# Patient Record
Sex: Female | Born: 1950 | Race: White | Hispanic: No | Marital: Married | Smoking: Never smoker
Health system: Southern US, Community
[De-identification: ages and names within clinical notes are randomized; demographics above are authoritative.]

## PROBLEM LIST (undated history)

## (undated) DIAGNOSIS — IMO0002 Reserved for concepts with insufficient information to code with codable children: Secondary | ICD-10-CM

## (undated) DIAGNOSIS — G2581 Restless legs syndrome: Secondary | ICD-10-CM

## (undated) DIAGNOSIS — M1990 Unspecified osteoarthritis, unspecified site: Secondary | ICD-10-CM

## (undated) DIAGNOSIS — M8580 Other specified disorders of bone density and structure, unspecified site: Secondary | ICD-10-CM

## (undated) DIAGNOSIS — G4700 Insomnia, unspecified: Secondary | ICD-10-CM

## (undated) HISTORY — PX: OTHER SURGICAL HISTORY: SHX169

## (undated) HISTORY — DX: Unspecified osteoarthritis, unspecified site: M19.90

## (undated) HISTORY — DX: Reserved for concepts with insufficient information to code with codable children: IMO0002

## (undated) HISTORY — DX: Insomnia, unspecified: G47.00

## (undated) HISTORY — DX: Restless legs syndrome: G25.81

## (undated) HISTORY — DX: Other specified disorders of bone density and structure, unspecified site: M85.80

## (undated) HISTORY — PX: BUNIONECTOMY: SHX129

---

## 1998-07-24 ENCOUNTER — Other Ambulatory Visit: Admission: RE | Admit: 1998-07-24 | Discharge: 1998-07-24 | Payer: Self-pay | Admitting: *Deleted

## 1999-07-24 ENCOUNTER — Other Ambulatory Visit: Admission: RE | Admit: 1999-07-24 | Discharge: 1999-07-24 | Payer: Self-pay | Admitting: *Deleted

## 1999-07-28 ENCOUNTER — Encounter: Admission: RE | Admit: 1999-07-28 | Discharge: 1999-07-28 | Payer: Self-pay | Admitting: *Deleted

## 1999-07-28 ENCOUNTER — Encounter: Payer: Self-pay | Admitting: *Deleted

## 1999-09-28 HISTORY — PX: ABDOMINAL HYSTERECTOMY: SHX81

## 1999-10-28 ENCOUNTER — Encounter (INDEPENDENT_AMBULATORY_CARE_PROVIDER_SITE_OTHER): Payer: Self-pay | Admitting: Specialist

## 1999-10-28 ENCOUNTER — Other Ambulatory Visit: Admission: RE | Admit: 1999-10-28 | Discharge: 1999-10-28 | Payer: Self-pay | Admitting: *Deleted

## 2000-06-13 ENCOUNTER — Other Ambulatory Visit: Admission: RE | Admit: 2000-06-13 | Discharge: 2000-06-13 | Payer: Self-pay | Admitting: *Deleted

## 2004-05-04 ENCOUNTER — Ambulatory Visit (HOSPITAL_COMMUNITY): Admission: RE | Admit: 2004-05-04 | Discharge: 2004-05-04 | Payer: Self-pay | Admitting: Gastroenterology

## 2004-05-04 ENCOUNTER — Encounter (INDEPENDENT_AMBULATORY_CARE_PROVIDER_SITE_OTHER): Payer: Self-pay | Admitting: *Deleted

## 2005-05-28 DIAGNOSIS — IMO0002 Reserved for concepts with insufficient information to code with codable children: Secondary | ICD-10-CM

## 2005-05-28 HISTORY — DX: Reserved for concepts with insufficient information to code with codable children: IMO0002

## 2005-05-28 HISTORY — PX: OTHER SURGICAL HISTORY: SHX169

## 2006-08-29 ENCOUNTER — Ambulatory Visit: Payer: Self-pay | Admitting: Pulmonary Disease

## 2006-10-11 ENCOUNTER — Ambulatory Visit: Payer: Self-pay | Admitting: Pulmonary Disease

## 2006-12-05 ENCOUNTER — Other Ambulatory Visit: Admission: RE | Admit: 2006-12-05 | Discharge: 2006-12-05 | Payer: Self-pay | Admitting: Obstetrics and Gynecology

## 2007-12-18 ENCOUNTER — Other Ambulatory Visit: Admission: RE | Admit: 2007-12-18 | Discharge: 2007-12-18 | Payer: Self-pay | Admitting: Obstetrics & Gynecology

## 2008-02-06 ENCOUNTER — Ambulatory Visit: Payer: Self-pay

## 2008-03-05 ENCOUNTER — Ambulatory Visit: Payer: Self-pay

## 2008-03-05 ENCOUNTER — Encounter (INDEPENDENT_AMBULATORY_CARE_PROVIDER_SITE_OTHER): Payer: Self-pay | Admitting: Family Medicine

## 2008-08-01 DIAGNOSIS — G4700 Insomnia, unspecified: Secondary | ICD-10-CM | POA: Insufficient documentation

## 2008-08-01 DIAGNOSIS — J309 Allergic rhinitis, unspecified: Secondary | ICD-10-CM | POA: Insufficient documentation

## 2008-08-02 ENCOUNTER — Ambulatory Visit: Payer: Self-pay | Admitting: Pulmonary Disease

## 2008-08-02 DIAGNOSIS — G2581 Restless legs syndrome: Secondary | ICD-10-CM

## 2008-08-05 LAB — CONVERTED CEMR LAB
Ferritin: 49.5 ng/mL (ref 10.0–291.0)
Iron: 108 ug/dL (ref 42–145)
Saturation Ratios: 30.4 % (ref 20.0–50.0)
Transferrin: 254.1 mg/dL (ref >=212.0)

## 2008-08-06 ENCOUNTER — Telehealth (INDEPENDENT_AMBULATORY_CARE_PROVIDER_SITE_OTHER): Payer: Self-pay | Admitting: *Deleted

## 2008-08-29 ENCOUNTER — Telehealth: Payer: Self-pay | Admitting: Pulmonary Disease

## 2009-08-04 ENCOUNTER — Telehealth: Payer: Self-pay | Admitting: Pulmonary Disease

## 2009-09-08 ENCOUNTER — Ambulatory Visit: Payer: Self-pay | Admitting: Pulmonary Disease

## 2010-07-29 ENCOUNTER — Ambulatory Visit: Payer: Self-pay | Admitting: Pulmonary Disease

## 2010-08-04 ENCOUNTER — Telehealth: Payer: Self-pay | Admitting: Pulmonary Disease

## 2010-08-07 ENCOUNTER — Telehealth: Payer: Self-pay | Admitting: Pulmonary Disease

## 2010-09-22 ENCOUNTER — Telehealth (INDEPENDENT_AMBULATORY_CARE_PROVIDER_SITE_OTHER): Payer: Self-pay | Admitting: *Deleted

## 2010-10-27 NOTE — Assessment & Plan Note (Signed)
Summary: rov for RLS   Visit Type:  Follow-up Primary Provider/Referring Provider:  Laurann Montana  CC:  follow up. pt states her med is not helping her rls. pt states she is doing the recs and still is not helping. Marland Kitchen  History of Present Illness: the pt comes in today for f/u of her sleep disruption due to RLS.  She has been on requip, and continuing to have abnormal sensations in  her legs with kicking during the night.  She is exercising regularly, and had a normal serum ferritin in the past.  She is on no meds which would result in augmentation.  Current Medications (verified): 1)  Flonase 50 Mcg/act  Susp (Fluticasone Propionate) .... Two Puffs Each Nostril Daily As Needed 2)  Claritin 10 Mg Tabs (Loratadine) .... Once Daily 3)  Vitamin D 16109 Unit Caps (Ergocalciferol) .... Take 1 Tab By Mouth Every Other Week 4)  Ropinirole Hcl 0.5 Mg Tabs (Ropinirole Hcl) .... Take 3 Tabs By Mouth At Bedtime 5)  Aspirin Low Dose 81 Mg Tabs (Aspirin) .... Take 1 Tablet By Mouth Once A Day 6)  Fish Oil .... Take 1 Tablet By Mouth Once A Day 7)  Vitamin E .... Take 1 Tablet By Mouth Once A Day 8)  Calcium .... Take 1 Tablet By Mouth Once A Day 9)  Womens One Daily  Tabs (Multiple Vitamins-Minerals) .... Once Daily  Allergies (verified): 1)  ! Sulfa  Review of Systems  The patient denies shortness of breath with activity, shortness of breath at rest, productive cough, non-productive cough, coughing up blood, chest pain, irregular heartbeats, acid heartburn, indigestion, loss of appetite, weight change, abdominal pain, difficulty swallowing, sore throat, tooth/dental problems, headaches, nasal congestion/difficulty breathing through nose, sneezing, itching, ear ache, anxiety, depression, hand/feet swelling, joint stiffness or pain, rash, change in color of mucus, and fever.    Vital Signs:  Patient profile:   60 year old female Height:      69 inches Weight:      154 pounds BMI:     22.82 O2 Sat:       99 % on Room air Temp:     98.3 degrees F oral Pulse rate:   63 / minute BP sitting:   122 / 64  (left arm) Cuff size:   regular  Vitals Entered By: Carver Fila (July 29, 2010 3:13 PM)  O2 Flow:  Room air CC: follow up. pt states her med is not helping her rls. pt states she is doing the recs and still is not helping.  Comments meds and allergies updated Phone number updated Carver Fila  July 29, 2010 3:13 PM    Physical Exam  General:  wd female in nad Extremities:  no edema or cyanosis  Neurologic:  alert , oriented, moves all 4.   Impression & Recommendations:  Problem # 1:  RESTLESS LEGS SYNDROME (ICD-333.94) the pt has classic RLS that is not responding well to requip.  Typically, pt's are tried on different types of meds and different classes to see what works best for them.  She has had a normal iron panel in 2009, but may need to recheck if she continues to have symptoms.  Will try her on gabapentin to see how she does.  The other option is to switch requip to mirapex.  The pt will give me some feedback as to how things are going.  Medications Added to Medication List This Visit: 1)  Claritin 10  Mg Tabs (Loratadine) .... Once daily 2)  Ropinirole Hcl 0.5 Mg Tabs (Ropinirole hcl) .... Take 3 tabs by mouth at bedtime 3)  Womens One Daily Tabs (Multiple vitamins-minerals) .... Once daily  Other Orders: Est. Patient Level III (95638)  Patient Instructions: 1)  stop requip 2)  trial of Horizant 600mg  one at approximately 5pm with food.  Please call me in 2 weeks with your response.   Immunization History:  Influenza Immunization History:    Influenza:  historical (06/27/2010)

## 2010-10-27 NOTE — Progress Notes (Signed)
Summary: sub for HORIZANT  Phone Note Call from Patient   Caller: Patient Call For: clance Summary of Call: pt says that the rx for HORIZANT is tto exspensive. she wants to know if this can be subbed w/ MIRAPEX and what strength would  be recommended? pt wants to call [facility] and see how much the MIRAPEX costs. pt [phone] Initial call taken by: Tivis Ringer, CNA,  August 07, 2010 10:11 AM  Follow-up for Phone Call        pt states that the horizant is too expensive and does want to try the mirapex again...see prev phone note. RX has been sent to [facility] per pt request and changed on med list. Carron Curie CMA  August 07, 2010 11:35 AM     New/Updated Medications: MIRAPEX 0.25 MG TABS (PRAMIPEXOLE DIHYDROCHLORIDE) take one tablet each evening after dinner Prescriptions: MIRAPEX 0.25 MG TABS (PRAMIPEXOLE DIHYDROCHLORIDE) take one tablet each evening after dinner  #90 x 0   Entered by:   Carron Curie CMA   Authorized by:   Barbaraann Share MD   Signed by:   Carron Curie CMA on 08/07/2010   Method used:   Faxed to ...       [facility] (mail-order)             , [place]         Ph: [phone]       Fax: [phone]   RxID:   [id number]

## 2010-10-27 NOTE — Progress Notes (Signed)
Summary: questions about mirapex  Phone Note Call from Patient Call back at [phone]   Caller: Patient Call For: clance Summary of Call: FYI: pt states she wants the generic mirapex. Initial call taken by: Darletta Moll,  August 07, 2010 2:20 PM  Follow-up for Phone Call        lmomtcb for pt---looks like the rx for this was printed off today.  need more info. Randell [facility]  August 07, 2010 2:56 PM  LMOMTCB Vernie Murders  August 10, 2010 4:52 PM  [facility] x 3 Vernie Murders  August 11, 2010 11:33 AM   Additional Follow-up for Phone Call Additional follow up Details #1::        Spoke with pt.  She is aware that mirapex was sent to pharm.  She states that this is fine.  She is wanting to know why mirapex is such a low dose, being that her requip that she was taking was 0.5 mg.  I advised that they are two different meds.  She states that she does not understand this and wants to know if taking 1 mirapex at bedtime is not effective, if it would be okay to take 2.  Pls advise thanks Additional Follow-up by: Vernie Murders,  August 11, 2010 12:07 PM    Additional Follow-up for Phone Call Additional follow up Details #2::    they are 2 different medications!!!!!  you cannot compare meds by their mg's.  That said, I do increase mirapex to 2 of the 0.25mg  tabs if symptoms persist, but absolutely no higher. Follow-up by: Barbaraann Share MD,  August 11, 2010 5:29 PM  Additional Follow-up for Phone Call Additional follow up Details #3:: Details for Additional Follow-up Action Taken: pt advised. Carron Curie CMA  August 12, 2010 8:46 AM

## 2010-10-27 NOTE — Progress Notes (Signed)
Summary: horizant   Phone Note Call from Patient Call back at [phone]   Caller: Patient Call For: [facility] Summary of Call: HORIZANT IS WORKING WELL FOR RESTLESS LEG BUT IT TAKES A WHILE TO GET OUT OF HER SYSTEM THE NEXT DAY . WOULD LIKE TO NO IF SHE CAN TRY SOMETHING DIFFERENT. Initial call taken by: Rickard Patience,  August 04, 2010 1:53 PM  Follow-up for Phone Call        At OV on 11.2.11, pt was to trial Horizant 600mg  around 5pm with food and call back in 2 wks with report.    Called, spoke with pt.  She states the horizant is "very effective for the RLS" but states she feels like a "zomby" and in a "haze" the next morning until around 7-8am.  States she has taken this as early as 4pm and still feels this way the next day until 7-8am.  Pt would like to know if there are any other meds to try for the RLS that might not have this side effect.  Pt states if not she will stick with the Horizant.  Pt aware KC out of office until tomorrow and requesting call back tomorrow before 4pm if [facility] is going to rec another med.  Will forward message to him to address.  Follow-up by: Gweneth Dimitri RN,  August 04, 2010 2:54 PM  Additional Follow-up for Phone Call Additional follow up Details #1::        let her know that we can try mirapex.  Went back and saw where that was initially started, but was too expensive on her plan.  Happy to try again....can call in mirapex 0.25mg  in evening after dinner and see how she does.  #30, 6 fills.  If she likes, but symptoms not totally controlled, call me. Additional Follow-up by: Barbaraann Share MD,  August 05, 2010 9:28 AM    Additional Follow-up for Phone Call Additional follow up Details #2::    LMOMTCB.Michel Bickers CMA  August 05, 2010 9:32 AM Pt returned call from triage. Tivis Ringer, CNA  August 05, 2010 10:06 AM  Spoke with pt and she states that she thinks the mirapex did not work well for her RLS, so she prefers to stay on horizant. Pt  request rx sent to [facility]. Refill sent.  Carron Curie CMA  August 05, 2010 10:25 AM   New/Updated Medications: HORIZANT 600 MG XR24H-TAB (GABAPENTIN ENACARBIL) One tablet around 5 pm daily Prescriptions: HORIZANT 600 MG XR24H-TAB (GABAPENTIN ENACARBIL) One tablet around 5 pm daily  #90 x 0   Entered by:   Carron Curie CMA   Authorized by:   Barbaraann Share MD   Signed by:   Carron Curie CMA on 08/05/2010   Method used:   Faxed to ...       [facility] (mail-order)             , [place]         Ph: [phone]       Fax: [phone]   RxID:   [id number]

## 2010-10-29 NOTE — Progress Notes (Signed)
Summary: Mirapex faxed to [facility]  Phone Note Call from Patient Call back at Home Phone [phone]   Caller: Patient Call For: Dr. Shelle Iron Summary of Call: Patient phoned stated that she was seen in Late October early November and Dr. Greggory Keen put her on pramitecole (generic Miepex) for her restless leg syndrome. She would like a written prescription for [facility]. Please mail it to the patients home address. Patient can be reached at [phone] her cell. Initial call taken by: Vedia Coffer,  September 22, 2010 10:05 AM  Follow-up for Phone Call        pt was given #90 on 11.11.11 to [facility].  is another refill necessary?  LMOM TCB at home #.  ATC cell #, but NA and no option TLM. Boone Master CNA/MA  September 22, 2010 11:04 AM   pt requesting refill on mirapex. She was given a 90 dy rx on 08-07-10 with no refills. She states this med is working well for her and she is requesting we send in refills for her to have on file. Please advsie if ok to give refills? Carron Curie CMA  September 22, 2010 11:13 AM'    Additional Follow-up for Phone Call Additional follow up Details #1::        ok to give her enough to last until next nov/dec.  let her know that I would like to see her yearly while taking these meds. Additional Follow-up by: Barbaraann Share MD,  September 23, 2010 6:18 PM    Additional Follow-up for Phone Call Additional follow up Details #2::    Patient is aware RX sent to [facility] amd she will need to follow-up w/ [facility] on a yearly basis for refills. Follow-up by: Michel Bickers CMA,  September 24, 2010 8:51 AM  Prescriptions: MIRAPEX 0.25 MG TABS (PRAMIPEXOLE DIHYDROCHLORIDE) take one tablet each evening after dinner  #90 x 3   Entered by:   Michel Bickers CMA   Authorized by:   Barbaraann Share MD   Signed by:   Michel Bickers CMA on 09/24/2010   Method used:   Faxed to ...       [facility] (mail-order)             , [place]         Ph: [phone]       Fax: [phone]   RxID:    [id number]

## 2011-02-12 NOTE — Assessment & Plan Note (Signed)
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  At the last visit, we had decided to trazodone 50 mg nightly,  and also to work on stimulus control therapy.  The patient took the  trazodone and thought it helped some, but has continued to have a lot of  awakening times, some quite long in duration.  She did the stimulus  control therapy a few times, but really did not stick with this on a  regular and consistent basis.  She continues to have frequent awakenings  during the night, and is really unsure why this is occurring.  She was  on Requip 1 mg for what sounds like classic restless leg syndrome,  however, was having leg symptoms earlier in the night.  I asked her to  take her Requip at dinner rather than at bedtime, and this would  probably help with this.   PHYSICAL EXAMINATION:  BP is 110/68, pulse 64, temperature 98.3, weight  is 153 pounds.  O2 saturation on room air is 98%.   IMPRESSION:  Psychophysiologic insomnia.  The patient really needs to  stay on stimulus control on a consistent basis over a few weeks to see  improvement.  I think we can also increase her trazodone since she is at  a fairly low dose to help with sleep onset and sleep maintenance.  The  patient is agreeable to this approach.  We still have not considered the  possibility of some other type of sleep disorder causing her awakenings  during the night.  We need to keep this in mind.  If she continues to  have difficulties, she may need a sleep study to rule out the  possibility of sleep apnea or poorly controlled periodic leg movements  despite being on Requip.   SUGGESTIONS:  1. I have asked the patient to increase trazodone to 100 mg  nightly.  2. Stick with her stimulus control therapy on a regular basis, every      night if necessary.  3. The patient is to call me in 2 weeks to let me know how things are      going with the higher dose of trazodone and really trying to stick      with the behavioral modification.  4. Because of the patient's restless leg, I think it would be      worthwhile to check and iron panel to make sure that she is not      iron deficient.  I will leave this to Dr. Lucilla Lame discretion.     Barbaraann Share, MD,FCCP  Electronically Signed    [facility]  DD: 10/11/2006  DT: 10/11/2006  Job #: [id number]   cc:   Stacie Acres. Cliffton Asters, M.D.

## 2011-02-12 NOTE — Op Note (Signed)
NAMEHarvel Shepherd                         ACCOUNT NO.:  [id number]   MEDICAL RECORD NO.:  [id number]                   PATIENT TYPE:  AMB   LOCATION:  ENDO                                 FACILITY:  [facility]   PHYSICIAN:  Petra Kuba, M.D.                 DATE OF BIRTH:  Apr 04, 1951   DATE OF PROCEDURE:  05/04/2004  DATE OF DISCHARGE:                                 OPERATIVE REPORT   PROCEDURE:  Colonoscopy with biopsy.   INDICATIONS FOR PROCEDURE:  Screening.  Consent was signed after risks,  benefits, methods and options were thoroughly discussed in the office.   MEDICATIONS:  Demerol 50, Versed 6.   DESCRIPTION OF PROCEDURE:  Rectal inspection is pertinent for external  hemorrhoids, small.  Digital examination was negative.  Video pediatric  colonoscope was inserted and with rolling on her back and then on her right  side and various abdominal pressures, was able to be advanced to the cecum.  She did have a tortuous long looping colon.  No abnormalities were seen on  insertion.  The cecum was identified by the appendiceal orifice and the  ileocecal valve.  Scope was slowly withdrawn.  Just next to the appendiceal  orifice was a questionable polyp which was cold biopsied x3.  Scope was  further withdrawn.  The prep was adequate.  There was some liquid stool that  required suctioning only.  On withdrawal through the colon, no other  abnormalities were seen.  We fell back around a particularly tortuous loop.  We did try to readvance around the curves and decrease chances of missing  things.  Once back in the rectum, anal and rectal pull through and  retroflexion confirms some small hemorrhoids.  Scope was reinserted a short  distance up the right side of the colon.  Air was suctioned, scope was  removed.  The patient tolerated the procedure well.  There were no obvious  immediate complications.   ENDOSCOPIC DIAGNOSIS:  1. Small internal and external hemorrhoids.  2. Tortuous  long looping colon.  3. Cecum cold biopsy otherwise within normal limits to the cecum.   PLAN:  Await pathology.  Will probably recheck colon screening in five  years.  Consider a virtual colonoscopy at some point in the future, when  widely available and happy to see back sooner p.r.n.                                               Petra Kuba, M.D.    [facility]/MED[facility]  D:  05/04/2004  T:  05/04/2004  Job:  [id number]   cc:   Stacie Acres. White, M.D.  [address]  [place]  [place] [address]  Fax: [phone]

## 2011-09-15 ENCOUNTER — Other Ambulatory Visit: Payer: Self-pay | Admitting: Pulmonary Disease

## 2011-09-16 ENCOUNTER — Telehealth: Payer: Self-pay | Admitting: Pulmonary Disease

## 2011-09-16 NOTE — Telephone Encounter (Signed)
Will need ov- LMTCB

## 2011-09-17 MED ORDER — ROPINIROLE HCL 0.5 MG PO TABS: ORAL_TABLET | ORAL | Status: DC

## 2011-09-17 NOTE — Telephone Encounter (Signed)
Rx has been signed [facility].

## 2011-09-17 NOTE — Telephone Encounter (Signed)
Called spoke with patient who reported that she made an appt yesterday > 1.14.13 @ 4:15pm.  Pt aware may have rx but MUST keep appt for future refills.  Pt verbalized her understanding.  rx printed for 90 days supply with no refills for KC to sign.

## 2011-09-29 ENCOUNTER — Telehealth: Payer: Self-pay | Admitting: Pulmonary Disease

## 2011-09-29 MED ORDER — PRAMIPEXOLE DIHYDROCHLORIDE 0.25 MG PO TABS: 0.2500 mg | ORAL_TABLET | Freq: Every evening | ORAL | Status: DC

## 2011-09-29 NOTE — Telephone Encounter (Signed)
Spoke with pt. She states that we sent in rx for requip, and should have sent in rx for mirapex. She states that she no longer takes requip and needs mirapex asap. I have verified this looking back at her old notes and have sent in the correct med to [facility]. She is aware to keep her appt with [facility] on 10/11/11 for additional refills.

## 2011-10-08 ENCOUNTER — Encounter: Payer: Self-pay | Admitting: Pulmonary Disease

## 2011-10-11 ENCOUNTER — Encounter: Payer: Self-pay | Admitting: Pulmonary Disease

## 2011-10-11 ENCOUNTER — Ambulatory Visit (INDEPENDENT_AMBULATORY_CARE_PROVIDER_SITE_OTHER): Payer: BC Managed Care – PPO | Admitting: Pulmonary Disease

## 2011-10-11 VITALS — BP 122/78 | HR 65 | Temp 98.1°F | Ht 69.0 in | Wt 154.0 lb

## 2011-10-11 DIAGNOSIS — G2581 Restless legs syndrome: Secondary | ICD-10-CM

## 2011-10-11 MED ORDER — PRAMIPEXOLE DIHYDROCHLORIDE 0.25 MG PO TABS: ORAL_TABLET | ORAL | Status: DC

## 2011-10-11 NOTE — Patient Instructions (Signed)
Consider changing timing and dosing of mirapex.  Can take as early as 5pm, and can take 2 instead of one if having a "bad night". Please let us know if you are having breakthru symptoms that are bothering you. followup with me in one year.

## 2011-10-11 NOTE — Progress Notes (Signed)
  Subjective:    Patient ID: Erin Shepherd, female    DOB: 18-Mar-1951, 61 y.o.   MRN: [id number]  HPI The patient comes in today for followup of her known RLS.  Overall, she is doing fairly well with Mirapex, although she does occasionally have breakthrough symptoms.  Primarily she has seen the symptoms started early on some days, and I told her that she can take her dose at that time.  She also occasionally requires an extra 0.25 mg dose, and I have told her this is okay as well.   Review of Systems  Constitutional: Negative for fever and unexpected weight change.  HENT: Negative for ear pain, nosebleeds, congestion, sore throat, rhinorrhea, sneezing, trouble swallowing, dental problem, postnasal drip and sinus pressure.   Eyes: Negative for redness and itching.  Respiratory: Negative for cough, chest tightness, shortness of breath and wheezing.   Cardiovascular: Negative for palpitations and leg swelling.  Gastrointestinal: Negative for nausea and vomiting.  Genitourinary: Negative for dysuria.  Musculoskeletal: Negative for joint swelling.  Skin: Negative for rash.  Neurological: Negative for headaches.  Hematological: Does not bruise/bleed easily.  Psychiatric/Behavioral: Negative for dysphoric mood. The patient is not nervous/anxious.        Objective:   Physical Exam Well-developed female in no acute distress Nose with no purulence or discharge noted Lower extremities without edema, no cyanosis Alert and oriented, does not appear to be sleepy, moves all 4 extremities.       Assessment & Plan:

## 2011-10-11 NOTE — Assessment & Plan Note (Signed)
Overall, the patient is doing well with respect to her restless leg syndrome on Mirapex.  She'll occasionally have early symptoms, and I have asked her to take her dose as early as 5 p.m. If this occurs.  I have also told her that she can take an extra dose if she has persistent symptoms or if she is having "a bad night".  She will followup with me in one year.

## 2012-09-23 ENCOUNTER — Ambulatory Visit (INDEPENDENT_AMBULATORY_CARE_PROVIDER_SITE_OTHER): Payer: BC Managed Care – PPO | Admitting: Physician Assistant

## 2012-09-23 VITALS — BP 128/74 | HR 68 | Temp 98.3°F | Resp 16 | Ht 68.5 in | Wt 155.0 lb

## 2012-09-23 DIAGNOSIS — R350 Frequency of micturition: Secondary | ICD-10-CM

## 2012-09-23 DIAGNOSIS — R319 Hematuria, unspecified: Secondary | ICD-10-CM

## 2012-09-23 DIAGNOSIS — R300 Dysuria: Secondary | ICD-10-CM

## 2012-09-23 LAB — POCT URINALYSIS DIPSTICK
Bilirubin, UA: NEGATIVE
Glucose, UA: NEGATIVE
Ketones, UA: NEGATIVE
Nitrite, UA: NEGATIVE
Protein, UA: NEGATIVE
Spec Grav, UA: 1.01
Urobilinogen, UA: 0.2
pH, UA: 7

## 2012-09-23 LAB — POCT UA - MICROSCOPIC ONLY
Casts, Ur, LPF, POC: NEGATIVE
Crystals, Ur, HPF, POC: NEGATIVE
Mucus, UA: NEGATIVE
Yeast, UA: NEGATIVE

## 2012-09-23 MED ORDER — CIPROFLOXACIN HCL 500 MG PO TABS: 500.0000 mg | ORAL_TABLET | Freq: Two times a day (BID) | ORAL | Status: DC

## 2012-09-23 NOTE — Patient Instructions (Addendum)
Azo three times daily as needed   Ganglion A ganglion is a swelling under the skin that is filled with a thick, jelly-like substance. It is a synovial cyst. This is caused by a break (rupture) of the joint lining from the joint space. A ganglion often occurs near an area of repeated minor trauma (damage caused by an accident). Trauma may also be a repetitive movement at work or in a sport. TREATMENT  It often goes away without treatment. It may reappear later. Sometimes a ganglion may need to be surgically removed. Often they are drained and injected with a steroid. Sometimes they respond to:  Rest.  Splinting. HOME CARE INSTRUCTIONS   Your caregiver will decide the best way of treating your ganglion. Do not try to break the ganglion yourself by pressing on it, poking it with a needle, or hitting it with a heavy object.  Use medications as directed. SEEK MEDICAL CARE IF:   The ganglion becomes larger or more painful.  You have increased redness or swelling.  You have weakness or numbness in your hand or wrist. MAKE SURE YOU:   Understand these instructions.  Will watch your condition.  Will get help right away if you are not doing well or get worse. Document Released: 09/10/2000 Document Revised: 12/06/2011 Document Reviewed: 11/07/2007 [facility] Patient Information 2013 [place], [place].

## 2012-09-23 NOTE — Progress Notes (Signed)
Patient ID: Erin Shepherd MRN: [id number], DOB: Oct 29, 1950, 61 y.o. Date of Encounter: 09/23/2012, 10:03 AM  Primary Physician: Cala Bradford, MD  Chief Complaint: urinary frequency and dysuria  HPI: 61 y.o. year old female with presents with 1 day history of urinary frequency, dysuria, and suprapubic pressure. Denies flank pain, nausea, vomiting, fever, chills, vaginal discharge, or odor. States symptoms started abruptly last night with urinary frequency throughout the night. She has had hesitancy and has noticed hematuria.   Patient is otherwise doing well without issues or complaints.  Past Medical History  Diagnosis Date  . Restless leg syndrome   . Insomnia   . Allergic rhinitis   . Arthritis      Home Meds: Prior to Admission medications   Medication Sig Start Date End Date Taking? Authorizing Provider  aspirin 81 MG tablet Take 81 mg by mouth daily.    Yes Historical Provider, MD  Fexofenadine HCl (ALLEGRA PO) Take by mouth every other day.   Yes Historical Provider, MD  fish oil-omega-3 fatty acids 1000 MG capsule Take 1 capsule by mouth daily.   Yes Historical Provider, MD  fluticasone (FLONASE) 50 MCG/ACT nasal spray Place 2 sprays into the nose every other day.    Yes Historical Provider, MD  Multiple Vitamin (MULTIVITAMIN) capsule Take 1 capsule by mouth daily.   Yes Historical Provider, MD  pramipexole (MIRAPEX) 0.25 MG tablet Take 1 to 2 tabs daily at bedtime. 10/11/11  Yes Barbaraann Share, MD  Vitamin E (VITA-PLUS E PO) Take 1 capsule by mouth daily.   Yes Historical Provider, MD  ciprofloxacin (CIPRO) 500 MG tablet Take 1 tablet (500 mg total) by mouth 2 (two) times daily. 09/23/12   Nelva Nay, PA-C    Allergies:  Allergies  Allergen Reactions  . Sulfonamide Derivatives     History   Social History  . Marital Status: Married    Spouse Name: N/A    Number of Children: Y  . Years of Education: N/A   Occupational History  . Not on file.   Social  History Main Topics  . Smoking status: Never Smoker   . Smokeless tobacco: Not on file  . Alcohol Use: No  . Drug Use: No  . Sexually Active: Not on file   Other Topics Concern  . Not on file   Social History Narrative  . No narrative on file     Review of Systems: Constitutional: negative for chills, fever, night sweats, weight changes, or fatigue  HEENT: negative for vision changes, hearing loss, congestion, rhinorrhea, ST, epistaxis, or sinus pressure Cardiovascular: negative for chest pain or palpitations Respiratory: negative for cough, hemoptysis, wheezing, shortness of breath. Abdominal: positive for suprapubic abdominal pain,   No nausea, vomiting, diarrhea, or constipation Genitourinary: positive for urinary frequency, hematuria, and dysuria. Negative for vaginal discharge, odor, or pelvic pain.  Dermatological: negative for rashes. Neurologic: negative for headache, dizziness, or syncope   Physical Exam: Blood pressure 128/74, pulse 68, temperature 98.3 F (36.8 C), resp. rate 16, height 5' 8.5" (1.74 m), weight 155 lb (70.308 kg)., Body mass index is 23.23 kg/(m^2). General: Well developed, well nourished, in no acute distress. Head: Normocephalic, atraumatic, eyes without discharge, sclera non-icteric, nares are without discharge. External ear normal in appearance. Neck: Supple. No thyromegaly. Full ROM. No lymphadenopathy. Lungs: Clear bilaterally to auscultation without wheezes, rales, or rhonchi. Breathing is unlabored. Heart: RRR with S1 S2. No murmurs, rubs, or gallops appreciated. Abdominal: +BS x 4. No  hepatosplenomegaly, rebound tenderness, or guarding. Positive suprapubic tenderness. No CVA tenderness bilaterally.  Msk:  Strength and tone normal for age. Extremities/Skin: Warm and dry. No clubbing or cyanosis. No edema.  Neuro: Alert and oriented X 3. Moves all extremities spontaneously. Gait is normal. CNII-XII grossly in tact. Psych:  Responds to questions  appropriately with a normal affect.   Labs: Results for orders placed in visit on 09/23/12  POCT URINALYSIS DIPSTICK      Component Value Range   Color, UA yellow     Clarity, UA clear     Glucose, UA neg     Bilirubin, UA neg     Ketones, UA neg     Spec Grav, UA 1.010     Blood, UA mod     pH, UA 7.0     Protein, UA neg     Urobilinogen, UA 0.2     Nitrite, UA neg     Leukocytes, UA small (1+)    POCT UA - MICROSCOPIC ONLY      Component Value Range   WBC, Ur, HPF, POC 10-15     RBC, urine, microscopic 4-8     Bacteria, U Microscopic small     Mucus, UA neg     Epithelial cells, urine per micros 2-3     Crystals, Ur, HPF, POC neg     Casts, Ur, LPF, POC neg     Yeast, UA neg       ASSESSMENT AND PLAN:  61 y.o. year old female with urinary tract infection Urine culture sent Increase fluids Cipro 500 mg bid x 5 days Azo as needed for symptomatic relief Follow up if symptoms worsen or fail to improve.  Grier Mitts, PA-C 09/23/2012 10:03 AM

## 2012-09-23 NOTE — Progress Notes (Deleted)
  Subjective:    Patient ID: Erin Shepherd, female    DOB: 10/05/1950, 61 y.o.   MRN: [id number]  HPI    Review of Systems     Objective:   Physical Exam        Assessment & Plan:  Dr. Geoffry Paradise at [facility]

## 2012-09-25 LAB — URINE CULTURE: Colony Count: >=100000

## 2012-11-21 ENCOUNTER — Encounter: Payer: Self-pay | Admitting: Pulmonary Disease

## 2012-11-21 ENCOUNTER — Ambulatory Visit (INDEPENDENT_AMBULATORY_CARE_PROVIDER_SITE_OTHER): Payer: BC Managed Care – PPO | Admitting: Pulmonary Disease

## 2012-11-21 VITALS — BP 126/80 | HR 56 | Temp 98.1°F | Ht 69.0 in | Wt 157.8 lb

## 2012-11-21 DIAGNOSIS — G2581 Restless legs syndrome: Secondary | ICD-10-CM

## 2012-11-21 MED ORDER — PRAMIPEXOLE DIHYDROCHLORIDE 0.25 MG PO TABS: ORAL_TABLET | ORAL | Status: DC

## 2012-11-21 NOTE — Assessment & Plan Note (Signed)
The patient is doing well from a restless leg standpoint, and I have asked her to continue on Mirapex.  We have gone over various dosing schemes, and she will see what works best for her.  I have told her not to take any more than 3 tablets a day.  She will followup with me in one year, but call if having issues.

## 2012-11-21 NOTE — Patient Instructions (Addendum)
You can take up to 3 mirapex tabs a day as we discussed.  Will send in prescription. followup with me in one year, but call if having issues before then.

## 2012-11-21 NOTE — Progress Notes (Signed)
  Subjective:    Patient ID: Erin Shepherd, female    DOB: July 20, 1951, 62 y.o.   MRN: [id number]  HPI The patient comes in today for followup of her restless leg syndrome.  She is on Mirapex, and feels that she has done very well on this.  She takes 2 tablets on most days, but will sometimes require 3.  Overall she is satisfied with the control of her leg movements, and feels that she is sleeping well.   Review of Systems  Constitutional: Negative for fever and unexpected weight change.  HENT: Positive for rhinorrhea and postnasal drip. Negative for ear pain, nosebleeds, congestion, sore throat, sneezing, trouble swallowing, dental problem and sinus pressure.   Eyes: Negative for redness and itching.  Respiratory: Negative for cough, chest tightness, shortness of breath and wheezing.   Cardiovascular: Negative for palpitations and leg swelling.  Gastrointestinal: Negative for nausea and vomiting.  Genitourinary: Negative for dysuria.  Musculoskeletal: Negative for joint swelling.  Skin: Negative for rash.  Neurological: Negative for headaches.  Hematological: Does not bruise/bleed easily.  Psychiatric/Behavioral: Negative for dysphoric mood. The patient is not nervous/anxious.        Objective:   Physical Exam Well-developed female in no acute distress Nose without purulence or discharge noted Lower extremities without edema, no cyanosis Alert and oriented, does not appear to be sleepy, moves all 4 extremities.       Assessment & Plan:

## 2013-03-27 HISTORY — PX: OTHER SURGICAL HISTORY: SHX169

## 2013-04-09 ENCOUNTER — Other Ambulatory Visit: Payer: Self-pay | Admitting: Dermatology

## 2013-04-12 ENCOUNTER — Ambulatory Visit: Payer: BC Managed Care – PPO | Admitting: Obstetrics & Gynecology

## 2013-04-23 ENCOUNTER — Encounter: Payer: Self-pay | Admitting: Obstetrics & Gynecology

## 2013-04-24 ENCOUNTER — Ambulatory Visit (INDEPENDENT_AMBULATORY_CARE_PROVIDER_SITE_OTHER): Payer: BC Managed Care – PPO | Admitting: Obstetrics & Gynecology

## 2013-04-24 ENCOUNTER — Ambulatory Visit: Payer: BC Managed Care – PPO | Admitting: Obstetrics & Gynecology

## 2013-04-24 ENCOUNTER — Encounter: Payer: Self-pay | Admitting: Obstetrics & Gynecology

## 2013-04-24 VITALS — BP 140/70 | HR 58 | Resp 16 | Ht 68.5 in | Wt 153.2 lb

## 2013-04-24 DIAGNOSIS — Z01419 Encounter for gynecological examination (general) (routine) without abnormal findings: Secondary | ICD-10-CM

## 2013-04-24 DIAGNOSIS — Z124 Encounter for screening for malignant neoplasm of cervix: Secondary | ICD-10-CM

## 2013-04-24 DIAGNOSIS — Z0000 Encounter for general adult medical examination without abnormal findings: Secondary | ICD-10-CM

## 2013-04-24 LAB — POCT URINALYSIS DIPSTICK
Leukocytes, UA: NEGATIVE
Nitrite, UA: NEGATIVE
Protein, UA: NEGATIVE
Urobilinogen, UA: NEGATIVE
pH, UA: 5

## 2013-04-24 MED ORDER — CYCLOBENZAPRINE HCL 10 MG PO TABS: 10.0000 mg | ORAL_TABLET | ORAL | Status: DC | PRN

## 2013-04-24 NOTE — Addendum Note (Signed)
Addended by: Clide Dales R on: 04/24/2013 02:24 PM   Modules accepted: Orders

## 2013-04-24 NOTE — Progress Notes (Signed)
62 y.o. G1P1 MarriedCaucasianF here for annual exam.  She is separated.  Husband moved back with 58 year old mother because she can't live alone anymore.  Husband told her he would move back in a "few" months.  He doesn't come home at all now.  He forgot big event for patient in May.  Separated in June.  They went to [place] marriage counselor.  Patient just does not feel important.    Went to [employer] about a week ago.  Having trouble sleeping.  On Trazadone but this isn't working.  She is going to increase and will call if doesn't seem to work for her.  Patient's last menstrual period was 09/28/1999.          Sexually active: no  The current method of family planning is status post hysterectomy.    Exercising: yes  stair climbing at work, [employer] and machines Smoker:  no  Health Maintenance: Pap:  03/11/11 WNL History of abnormal Pap:  no MMG:  3/14-[facility]-per patient normal Colonoscopy:  12/13 repeat in 4 years (Dr. Ewing Schlein) BMD:   11/27/09 stable TDaP:  Up to date-given at work Screening Labs: 9/13, Hb today: not done, Urine today: negative   reports that she has never smoked. She has never used smokeless tobacco. She reports that she drinks about 2.0 ounces of alcohol per week. She reports that she does not use illicit drugs.  Past Medical History  Diagnosis Date  . Restless leg syndrome   . Insomnia   . Allergic rhinitis   . Arthritis   . Osteopenia   . 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 Squamous cell carcinoma 04/09/13    left collar bone-about one inch from area removed in 2006    Past Surgical History  Procedure Laterality Date  . Abdominal hysterectomy    . Cesarean section    . Bunionectomy    . Tubes in ear      as a child  . Squamous cell carcinoma removed  9/06    left collar bone  . Squamous cell carcinoma removed  7/14    left collar bone    Current Outpatient Prescriptions  Medication Sig Dispense Refill  . aspirin 81 MG tablet  Take 81 mg by mouth daily.       Marland Kitchen CINNAMON PO Take by mouth. Occasionally      . cyclobenzaprine (FLEXERIL) 10 MG tablet 10 mg as needed.      . diclofenac (CATAFLAM) 50 MG tablet 50 mg as needed.      Marland Kitchen Fexofenadine HCl (ALLEGRA PO) Take by mouth every other day. During Spring season increase to 1 qd      . fish oil-omega-3 fatty acids 1000 MG capsule Take 4 capsules by mouth daily.       . fluticasone (FLONASE) 50 MCG/ACT nasal spray Place 2 sprays into the nose every other day.       . pramipexole (MIRAPEX) 0.25 MG tablet Take 1 to 2 tabs daily at bedtime.  270 tablet  4  . traZODone (DESYREL) 50 MG tablet 50 mg as needed.      . Vitamin D, Ergocalciferol, (DRISDOL) 50000 UNITS CAPS Take 50,000 Units by mouth. Every other week       No current facility-administered medications for this visit.    Family History  Problem Relation Age of Onset  . Emphysema Mother   . Asthma Mother   . Heart disease Mother   .  Hypertension Mother     ROS:  Pertinent items are noted in HPI.  Otherwise, a comprehensive ROS was negative.  Exam:   BP 140/70  Pulse 58  Resp 16  Ht 5' 8.5" (1.74 m)  Wt 153 lb 3.2 oz (69.491 kg)  BMI 22.95 kg/m2  LMP 09/28/1999  Weight change: -2 lbs   Height: 5' 8.5" (174 cm)  Ht Readings from Last 3 Encounters:  04/24/13 5' 8.5" (1.74 m)  11/21/12 5\' 9"  (1.753 m)  09/23/12 5' 8.5" (1.74 m)    General appearance: alert, cooperative and appears stated age Head: Normocephalic, without obvious abnormality, atraumatic Neck: no adenopathy, supple, symmetrical, trachea midline and thyroid normal to inspection and palpation Lungs: clear to auscultation bilaterally Breasts: normal appearance, no masses or tenderness Heart: regular rate and rhythm Abdomen: soft, non-tender; bowel sounds normal; no masses,  no organomegaly Extremities: extremities normal, atraumatic, no cyanosis or edema Skin: Skin color, texture, turgor normal. No rashes or lesions Lymph nodes:  Cervical, supraclavicular, and axillary nodes normal. No abnormal inguinal nodes palpated Neurologic: Grossly normal   Pelvic: External genitalia:  no lesions              Urethra:  normal appearing urethra with no masses, tenderness or lesions              Bartholins and Skenes: normal                 Vagina: normal appearing vagina with normal color and discharge, no lesions              Cervix: absent              Pap taken: yes Bimanual Exam:  Uterus:  uterus absent              Adnexa: normal adnexa and no mass, fullness, tenderness               Rectovaginal: Confirms               Anus:  normal sphincter tone, no lesions  A:  Well Woman with normal exam H/O TAH Osteopenia Low Vitamin D  P:   Mammogram at [place].  Release signed. Release for colonoscopy signed. pap smear today.  Patient wants every other year.  Aware of guidelines. She will fax me copies of labs done at work for review. Flexeril Rx to pt.  #30/2RF.  She uses prn. return annually or prn  An After Visit Summary was printed and given to the patient.

## 2013-04-24 NOTE — Patient Instructions (Addendum)

## 2013-04-25 NOTE — Addendum Note (Signed)
Addended by: Jerene Bears on: 04/25/2013 10:19 PM   Modules accepted: Orders

## 2013-04-27 LAB — IPS PAP SMEAR ONLY

## 2013-04-30 ENCOUNTER — Telehealth: Payer: Self-pay | Admitting: Obstetrics & Gynecology

## 2013-04-30 NOTE — Telephone Encounter (Signed)
Patient calling to give date of last tetanus shot: 10/23/2010. FYI for our records.

## 2013-05-03 ENCOUNTER — Encounter: Payer: Self-pay | Admitting: Obstetrics & Gynecology

## 2013-06-11 ENCOUNTER — Other Ambulatory Visit: Payer: Self-pay | Admitting: Obstetrics & Gynecology

## 2013-06-11 NOTE — Telephone Encounter (Signed)
AEX Scheduled of for 07/04/13; patient takes Vit D q o week according last Vit D & AEX note. #4/0rf's sent through to last patient until AEX

## 2013-06-14 ENCOUNTER — Other Ambulatory Visit: Payer: Self-pay | Admitting: Nurse Practitioner

## 2013-06-14 NOTE — Telephone Encounter (Signed)
eScribe request for refill on FLONASE Last filled - 03/15/12 Last AEX - 04/24/13 Next AEX - 07/04/14 Please advise refills.  Paper chart in your basket.

## 2013-08-02 ENCOUNTER — Other Ambulatory Visit: Payer: Self-pay

## 2013-08-12 ENCOUNTER — Other Ambulatory Visit: Payer: Self-pay | Admitting: Obstetrics & Gynecology

## 2013-08-13 NOTE — Telephone Encounter (Signed)
eScribe request for refill on VITAMIN D 16109 Last filled - 03/15/12, X 1 YEAR Last AEX - 04/24/13 Next AEX - 07/04/14 Last Vitamin D - 03/15/12 = 52 Please advise refills.

## 2013-09-04 ENCOUNTER — Telehealth: Payer: Self-pay | Admitting: Obstetrics & Gynecology

## 2013-09-04 MED ORDER — VITAMIN D (ERGOCALCIFEROL) 1.25 MG (50000 UNIT) PO CAPS: ORAL_CAPSULE | ORAL | Status: DC

## 2013-09-04 NOTE — Telephone Encounter (Signed)
Patient wants to switch where she is getting  Vitamin D, Ergocalciferol, (DRISDOL) 50000 UNITS CAPS capsule  TAKE 1 CAPSULE BY MOUTH ONCE A WEEK, Normal, Last Dose: Not Recorded  Refills: 1 ordered Pharmacy: [id number] - [place], [place] - [address]  [facility] Patient said she sent a fax December 3 and hasnt heard back.

## 2013-09-04 NOTE — Telephone Encounter (Signed)
[facility]  Will wait for Dr. Hyacinth Meeker to co-sign and then call patient.

## 2013-09-04 NOTE — Telephone Encounter (Signed)
Dr. Hyacinth Meeker has co-signed rx. Message left to advise "The Prescription that you have requested was sent to your pharmacy if you have any questions,  return call to [place] at [phone]."

## 2013-10-19 ENCOUNTER — Other Ambulatory Visit: Payer: Self-pay | Admitting: Obstetrics & Gynecology

## 2013-10-19 MED ORDER — VITAMIN D (ERGOCALCIFEROL) 1.25 MG (50000 UNIT) PO CAPS: ORAL_CAPSULE | ORAL | Status: DC

## 2013-10-19 NOTE — Progress Notes (Signed)
Vit D rx done for [facility].

## 2013-11-20 ENCOUNTER — Other Ambulatory Visit: Payer: Self-pay | Admitting: Obstetrics & Gynecology

## 2013-11-20 NOTE — Telephone Encounter (Signed)
Last AEX 04/24/2013 Last refill 06/14/2013 #16g/2 refill by Ms Chong Sicilian. Next appt 06/2014  Please advise.

## 2014-01-25 ENCOUNTER — Other Ambulatory Visit: Payer: Self-pay | Admitting: Pulmonary Disease

## 2014-05-28 ENCOUNTER — Ambulatory Visit (INDEPENDENT_AMBULATORY_CARE_PROVIDER_SITE_OTHER): Payer: BC Managed Care – PPO | Admitting: Pulmonary Disease

## 2014-05-28 ENCOUNTER — Encounter: Payer: Self-pay | Admitting: Pulmonary Disease

## 2014-05-28 VITALS — BP 140/80 | HR 53 | Temp 98.0°F | Ht 69.0 in | Wt 160.6 lb

## 2014-05-28 DIAGNOSIS — G2581 Restless legs syndrome: Secondary | ICD-10-CM

## 2014-05-28 NOTE — Progress Notes (Signed)
   Subjective:    Patient ID: Erin Shepherd, female    DOB: 1951/07/20, 63 y.o.   MRN: [id number]  HPI The patient comes in today for followup of her restless leg syndrome. She has been on Mirapex for her symptoms, and has done well. She has moved her dose to the late afternoon in order to control her early evening symptoms, and remains controlled through the rest of the night and while sleeping. She feels rested in the mornings upon arising, and is not having sleepiness during the day.   Review of Systems  Constitutional: Negative for fever and unexpected weight change.  HENT: Negative for congestion, dental problem, ear pain, nosebleeds, postnasal drip, rhinorrhea, sinus pressure, sneezing, sore throat and trouble swallowing.   Eyes: Negative for redness and itching.  Respiratory: Negative for cough, chest tightness, shortness of breath and wheezing.   Cardiovascular: Negative for palpitations and leg swelling.  Gastrointestinal: Negative for nausea and vomiting.  Genitourinary: Negative for dysuria.  Musculoskeletal: Negative for joint swelling.  Skin: Negative for rash.  Neurological: Negative for headaches.  Hematological: Does not bruise/bleed easily.  Psychiatric/Behavioral: Negative for dysphoric mood. The patient is not nervous/anxious.        Objective:   Physical Exam Well-developed female in no acute distress Nose without purulence or discharge noted Neck without lymphadenopathy or thyromegaly Lower extremities without edema, no cyanosis Alert and oriented, moves all 4 extremities.       Assessment & Plan:

## 2014-05-28 NOTE — Assessment & Plan Note (Signed)
The pt is doing well on her current dose and timing of mirapex.  She is sleeping well, and her symptoms are controlled in the early evening.  I have asked her to continue on her meds, and to stay on some type of exercise program (helps with the treatment of RLS).

## 2014-05-28 NOTE — Patient Instructions (Signed)
Continue on your mirapex, but let me know if you feel your symptoms are not adequately controlled. followup with me again in one year

## 2014-07-04 ENCOUNTER — Ambulatory Visit (INDEPENDENT_AMBULATORY_CARE_PROVIDER_SITE_OTHER): Payer: BC Managed Care – PPO | Admitting: Obstetrics & Gynecology

## 2014-07-04 ENCOUNTER — Encounter: Payer: Self-pay | Admitting: Obstetrics & Gynecology

## 2014-07-04 VITALS — BP 124/72 | HR 60 | Resp 16 | Ht 68.0 in | Wt 157.0 lb

## 2014-07-04 DIAGNOSIS — E559 Vitamin D deficiency, unspecified: Secondary | ICD-10-CM

## 2014-07-04 DIAGNOSIS — Z01419 Encounter for gynecological examination (general) (routine) without abnormal findings: Secondary | ICD-10-CM

## 2014-07-04 DIAGNOSIS — Z0000 Encounter for general adult medical examination without abnormal findings: Secondary | ICD-10-CM

## 2014-07-04 LAB — POCT URINALYSIS DIPSTICK
BILIRUBIN UA: NEGATIVE
Blood, UA: NEGATIVE
GLUCOSE UA: NEGATIVE
Ketones, UA: NEGATIVE
LEUKOCYTES UA: NEGATIVE
NITRITE UA: NEGATIVE
Protein, UA: NEGATIVE
Urobilinogen, UA: NEGATIVE
pH, UA: 5

## 2014-07-04 LAB — HEMOGLOBIN, FINGERSTICK: Hemoglobin, fingerstick: 13.4 g/dL (ref 12.0–16.0)

## 2014-07-04 MED ORDER — CYCLOBENZAPRINE HCL 10 MG PO TABS: 10.0000 mg | ORAL_TABLET | ORAL | Status: DC | PRN

## 2014-07-04 MED ORDER — VITAMIN D (ERGOCALCIFEROL) 1.25 MG (50000 UNIT) PO CAPS: 50000.0000 [IU] | ORAL_CAPSULE | ORAL | Status: DC

## 2014-07-04 NOTE — Progress Notes (Signed)
63 y.o. G1P1 MarriedCaucasianF here for annual exam.  Was separated but now back with him.  Pt reports his mother died in 06-Nov-2022 and this was the beginning of change.  Really happy.    No vaginal bleeding.    PCP:  Harlan Stains, MD.  Last appointment was in May.  She checks pt's blood work.    Patient's last menstrual period was 09/28/1999.          Sexually active: Yes.    The current method of family planning is status post hysterectomy.    Exercising: Yes.    run steps one hour/day 5 x week Smoker:  no  Health Maintenance: Pap:  04/24/13 WNL History of abnormal Pap:  no MMG:  12/07/13-normal Colonoscopy:  2013-repeat in 4 years, Dr. Watt Climes BMD:   11/27/09-stable, -1.8 TDaP:  Up to date with work Screening Labs: with PCP, Hb today: 13.4, Urine today: negative   reports that she has never smoked. She has never used smokeless tobacco. She reports that she drinks about 2 ounces of alcohol per week. She reports that she does not use illicit drugs.  Past Medical History  Diagnosis Date  . Restless leg syndrome   . Insomnia   . Allergic rhinitis   . Arthritis   . Osteopenia   . 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 Squamous cell carcinoma 04/09/13    left collar bone-about one inch from area removed in 2006    Past Surgical History  Procedure Laterality Date  . Abdominal hysterectomy  2001  . Bunionectomy    . Tubes in ear      as a child  . Squamous cell carcinoma removed  9/06    left collar bone  . Squamous cell carcinoma removed  7/14    left collar bone    Current Outpatient Prescriptions  Medication Sig Dispense Refill  . aspirin 81 MG tablet Take 81 mg by mouth daily.       . cyclobenzaprine (FLEXERIL) 10 MG tablet Take 1 tablet (10 mg total) by mouth as needed.  30 tablet  2  . diclofenac (CATAFLAM) 50 MG tablet 50 mg as needed.      Marland Kitchen escitalopram (LEXAPRO) 20 MG tablet Take 1 tablet by mouth daily.      Marland Kitchen Fexofenadine HCl (ALLEGRA PO) Take by  mouth every other day. During Spring season increase to 1 qd      . fish oil-omega-3 fatty acids 1000 MG capsule Take 4 capsules by mouth daily.       . fluticasone (FLONASE) 50 MCG/ACT nasal spray USE 1 SPRAY IN EACH NOSTRIL DAILY  16 g  12  . pramipexole (MIRAPEX) 0.25 MG tablet Take 1 to 2 tabs daily at bedtime.  270 tablet  4  . Vitamin D, Ergocalciferol, (DRISDOL) 50000 UNITS CAPS capsule TAKE 1 CAPSULE BY MOUTH EVERY OTHER WEEK       No current facility-administered medications for this visit.    Family History  Problem Relation Age of Onset  . Emphysema Mother   . Asthma Mother   . Heart disease Mother   . Hypertension Mother     ROS:  Pertinent items are noted in HPI.  Otherwise, a comprehensive ROS was negative.  Exam:   BP 124/72  Pulse 60  Resp 16  Ht 5\' 8"  (1.727 m)  Wt 157 lb (71.215 kg)  BMI 23.88 kg/m2  LMP 09/28/1999  Weight change: +4#  Height: 5\' 8"  (172.7 cm)  Ht Readings from Last 3 Encounters:  07/04/14 5\' 8"  (1.727 m)  05/28/14 5\' 9"  (1.753 m)  04/24/13 5' 8.5" (1.74 m)    General appearance: alert, cooperative and appears stated age Head: Normocephalic, without obvious abnormality, atraumatic Neck: no adenopathy, supple, symmetrical, trachea midline and thyroid normal to inspection and palpation Lungs: clear to auscultation bilaterally Breasts: normal appearance, no masses or tenderness Heart: regular rate and rhythm Abdomen: soft, non-tender; bowel sounds normal; no masses,  no organomegaly Extremities: extremities normal, atraumatic, no cyanosis or edema Skin: Skin color, texture, turgor normal. No rashes or lesions Lymph nodes: Cervical, supraclavicular, and axillary nodes normal. No abnormal inguinal nodes palpated Neurologic: Grossly normal    Pelvic: External genitalia:  no lesions              Urethra:  normal appearing urethra with no masses, tenderness or lesions              Bartholins and Skenes: normal                 Vagina:  normal appearing vagina with normal color and discharge, no lesions              Cervix: absent              Pap taken: No. Bimanual Exam:  Uterus:  uterus absent              Adnexa: no mass, fullness, tenderness               Rectovaginal: Confirms               Anus:  normal sphincter tone, no lesions  A:  Well Woman with normal exam  H/O TAH  Osteopenia  Low Vitamin D   P: Mammogram yearly.  Pap done 2014. Vit D level today Vit D 50K weekly.  #12/4RF Printed flexeril 10mg  po up to TID rx given.  #30/2RF return annually or prn  An After Visit Summary was printed and given to the patient.

## 2014-07-05 LAB — VITAMIN D 25 HYDROXY (VIT D DEFICIENCY, FRACTURES): Vit D, 25-Hydroxy: 49 ng/mL (ref 30–89)

## 2014-07-12 ENCOUNTER — Other Ambulatory Visit: Payer: Self-pay

## 2014-07-29 ENCOUNTER — Encounter: Payer: Self-pay | Admitting: Obstetrics & Gynecology

## 2014-09-30 ENCOUNTER — Ambulatory Visit
Admission: RE | Admit: 2014-09-30 | Discharge: 2014-09-30 | Disposition: A | Discharge status: Home / Self Care | Payer: BC Managed Care – PPO | Source: Ambulatory Visit | Attending: Family Medicine | Admitting: Family Medicine

## 2014-09-30 ENCOUNTER — Other Ambulatory Visit: Payer: Self-pay | Admitting: Family Medicine

## 2014-09-30 DIAGNOSIS — M25511 Pain in right shoulder: Secondary | ICD-10-CM

## 2014-12-04 ENCOUNTER — Other Ambulatory Visit: Payer: Self-pay | Admitting: Obstetrics & Gynecology

## 2014-12-04 NOTE — Telephone Encounter (Signed)
Medication refill request: Flonase Last AEX:  07/04/14 SM Next AEX: 07/08/15 SM Last MMG (if hormonal medication request): 12/07/13 BIRADS1:neg Refill authorized: 11/20/13 #16g/12R. Today please advise

## 2014-12-13 ENCOUNTER — Other Ambulatory Visit: Payer: Self-pay | Admitting: Obstetrics & Gynecology

## 2015-01-06 ENCOUNTER — Encounter: Payer: Self-pay | Admitting: Obstetrics & Gynecology

## 2015-01-06 ENCOUNTER — Telehealth: Payer: Self-pay | Admitting: Pulmonary Disease

## 2015-01-06 MED ORDER — PRAMIPEXOLE DIHYDROCHLORIDE 0.25 MG PO TABS: ORAL_TABLET | ORAL | Status: DC

## 2015-01-06 NOTE — Telephone Encounter (Signed)
Spoke with pt, needs a refill on Mirapex.  [facility]  Nothing further needed.

## 2015-02-28 ENCOUNTER — Other Ambulatory Visit: Payer: Self-pay | Admitting: Obstetrics & Gynecology

## 2015-02-28 NOTE — Telephone Encounter (Signed)
Medication refill request: Vitamin D Last AEX:  07/04/14 SM Next AEX: 07/11/15 DL Last MMG (if hormonal medication request): 11/2014 BIRADS2:Benign  Refill authorized: 07/04/14 #12caps w/ 4R. To [facility]

## 2015-05-30 ENCOUNTER — Ambulatory Visit: Payer: BC Managed Care – PPO | Admitting: Pulmonary Disease

## 2015-07-08 ENCOUNTER — Ambulatory Visit: Payer: BC Managed Care – PPO | Admitting: Obstetrics & Gynecology

## 2015-07-10 ENCOUNTER — Encounter: Payer: Self-pay | Admitting: Neurology

## 2015-07-10 ENCOUNTER — Ambulatory Visit (INDEPENDENT_AMBULATORY_CARE_PROVIDER_SITE_OTHER): Payer: BLUE CROSS/BLUE SHIELD | Admitting: Neurology

## 2015-07-10 VITALS — BP 140/82 | HR 76 | Resp 20 | Ht 69.0 in | Wt 166.0 lb

## 2015-07-10 DIAGNOSIS — G2581 Restless legs syndrome: Secondary | ICD-10-CM | POA: Insufficient documentation

## 2015-07-10 MED ORDER — ROPINIROLE HCL 0.5 MG PO TABS: 0.5000 mg | ORAL_TABLET | Freq: Two times a day (BID) | ORAL | Status: DC
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
## 2015-07-10 NOTE — Patient Instructions (Signed)
Restless Legs Syndrome Restless legs syndrome is a condition that causes uncomfortable feelings or sensations in the legs, especially while sitting or lying down. The sensations usually cause an overwhelming urge to move the legs. The arms can also sometimes be affected. The condition can range from mild to severe. The symptoms often interfere with a person's ability to sleep. CAUSES The cause of this condition is not known. RISK FACTORS This condition is more likely to develop in:  People who are older than age 50.  Pregnant women. In general, restless legs syndrome is more common in women than in men.  People who have a family history of the condition.  People who have certain medical conditions, such as iron deficiency, kidney disease, Parkinson disease, or nerve damage.  People who take certain medicines, such as medicines for high blood pressure, nausea, colds, allergies, depression, and some heart conditions. SYMPTOMS The main symptom of this condition is uncomfortable sensations in the legs. These sensations may be:  Described as pulling, tingling, prickling, throbbing, crawling, or burning.  Worse while you are sitting or lying down.  Worse during periods of rest or inactivity.  Worse at night, often interfering with your sleep.  Accompanied by a very strong urge to move your legs.  Temporarily relieved by movement of your legs. The sensations usually affect both sides of the body. The arms can also be affected, but this is rare. People who have this condition often have tiredness during the day because of their lack of sleep at night. DIAGNOSIS This condition may be diagnosed based on your description of the symptoms. You may also have tests, including blood tests, to check for other conditions that may lead to your symptoms. In some cases, you may be asked to spend some time in a sleep lab so your sleeping can be monitored. TREATMENT Treatment for this condition is  focused on managing the symptoms. Treatment may include:  Self-help and lifestyle changes.  Medicines. HOME CARE INSTRUCTIONS  Take medicines only as directed by your health care provider.  Try these methods to get temporary relief from the uncomfortable sensations:  Massage your legs.  Walk or stretch.  Take a cold or hot bath.  Practice good sleep habits. For example, go to bed and get up at the same time every day.  Exercise regularly.  Practice ways of relaxing, such as yoga or meditation.  Avoid caffeine and alcohol.  Do not use any tobacco products, including cigarettes, chewing tobacco, or electronic cigarettes. If you need help quitting, ask your health care provider.  Keep all follow-up visits as directed by your health care provider. This is important. SEEK MEDICAL CARE IF: Your symptoms do not improve with treatment, or they get worse.   This information is not intended to replace advice given to you by your health care provider. Make sure you discuss any questions you have with your health care provider.   Document Released: 09/03/2002 Document Revised: 01/28/2015 Document Reviewed: 09/09/2014 Elsevier Interactive Patient Education 2016 Elsevier Inc.  

## 2015-07-11 ENCOUNTER — Ambulatory Visit (INDEPENDENT_AMBULATORY_CARE_PROVIDER_SITE_OTHER): Payer: BLUE CROSS/BLUE SHIELD | Admitting: Certified Nurse Midwife

## 2015-07-11 ENCOUNTER — Encounter: Payer: Self-pay | Admitting: Certified Nurse Midwife

## 2015-07-11 VITALS — BP 114/64 | HR 70 | Resp 16 | Ht 68.25 in | Wt 166.0 lb

## 2015-07-11 DIAGNOSIS — Z01419 Encounter for gynecological examination (general) (routine) without abnormal findings: Secondary | ICD-10-CM | POA: Diagnosis not present

## 2015-07-11 DIAGNOSIS — Z0000 Encounter for general adult medical examination without abnormal findings: Secondary | ICD-10-CM | POA: Diagnosis not present

## 2015-07-11 LAB — POCT URINALYSIS DIPSTICK
Bilirubin, UA: NEGATIVE
Glucose, UA: NEGATIVE
KETONES UA: NEGATIVE
Leukocytes, UA: NEGATIVE
Nitrite, UA: NEGATIVE
PROTEIN UA: NEGATIVE
RBC UA: NEGATIVE
UROBILINOGEN UA: NEGATIVE
pH, UA: 5

## 2015-07-11 NOTE — Patient Instructions (Signed)
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
## 2015-07-16 NOTE — Progress Notes (Signed)
Encounter reviewed Odilia Damico, MD   

## 2015-07-29 ENCOUNTER — Telehealth: Payer: Self-pay | Admitting: Neurology

## 2015-07-29 DIAGNOSIS — G2581 Restless legs syndrome: Secondary | ICD-10-CM

## 2015-07-29 MED ORDER — CARBIDOPA-LEVODOPA 25-100 MG PO TABS: ORAL_TABLET | ORAL | Status: DC

## 2015-07-29 NOTE — Telephone Encounter (Signed)
I spoke to the patient. She requests a 30 day supply of Sinemet carbidopa levodopa 25/100 to permit [employer]. She is not interested in remaining on the Mirapex due to compulsive eating weight gain. Requip did not help her. She has not reached daily doses that would justify switching her to a patch Neupogen. CD

## 2015-07-29 NOTE — Telephone Encounter (Signed)
Patient is calling in regard to Rx Requip 0.5 mg and states this medicaiton is not helping her so she has gone back to her old medication.  Please advise!

## 2015-07-29 NOTE — Telephone Encounter (Signed)
Spoke with pt. She has stopped taking the requip 0.5 mg BID as prescribed by Dr. Brett Fairy on 07/10/2015 because it is not working.  She went back to taking mirapex 0.25 mg two tablets at 3:00 pm and one tablet at 5 pm, for a total of 0.75 mg daily. Pt believes that mirapex has caused weight gain and perhaps might contribute to OCD tendencies.  Pt wants to know if there is any other medications that might work for her RLS other than mirapex and requip. I advised her that I would speak to Dr. Brett Fairy for her recommendations and call her back. Pt verbalized understanding.

## 2015-08-04 ENCOUNTER — Telehealth: Payer: Self-pay | Admitting: Neurology

## 2015-08-04 NOTE — Telephone Encounter (Signed)
I advised pt per Dr. Brett Fairy, that she may need to take 2 pills in the evening. (3 pills total per day.) Otherwise, she will have to go back to the mirapex. Pt verbalized understanding. Pt will try it this weekend because she doesn't want to lose sleep if it doesn't work before her work the next day. I advised her to let us know how it works.

## 2015-08-04 NOTE — Telephone Encounter (Signed)
Patient is calling and states she took her new Rx carbidopa-levodopa 25-100 mg and the med did not help her restless legs at all.  She has started back on Rx Mirapex which is working.  Please call.

## 2015-08-04 NOTE — Telephone Encounter (Signed)
She may need to take 2 pills of carbidopa levodopa in PM.

## 2015-08-19 ENCOUNTER — Other Ambulatory Visit: Payer: Self-pay

## 2015-08-19 DIAGNOSIS — G2581 Restless legs syndrome: Secondary | ICD-10-CM

## 2015-08-19 MED ORDER — CARBIDOPA-LEVODOPA 25-100 MG PO TABS
ORAL_TABLET | ORAL | Status: DC
Start: 2015-08-19 — End: 2015-10-29

## 2015-08-19 NOTE — Telephone Encounter (Signed)
Pt called sts she has been taking carbidopa-levodopa (SINEMET) 25-100 MG tablet 2 tabs @ 3pm and 1 tab after dinner. This seems to be working great. She needs refill sent to [facility]. She can be reached at [phone] if need to contact her.

## 2015-08-19 NOTE — Telephone Encounter (Signed)
Rx has been sent.  Receipt confirmed by pharmacy.   

## 2015-10-14 ENCOUNTER — Ambulatory Visit: Payer: BLUE CROSS/BLUE SHIELD | Admitting: Neurology

## 2015-10-29 ENCOUNTER — Ambulatory Visit: Payer: BLUE CROSS/BLUE SHIELD | Admitting: Neurology

## 2015-10-29 ENCOUNTER — Encounter: Payer: Self-pay | Admitting: Neurology

## 2015-10-29 ENCOUNTER — Ambulatory Visit (INDEPENDENT_AMBULATORY_CARE_PROVIDER_SITE_OTHER): Payer: BLUE CROSS/BLUE SHIELD | Admitting: Neurology

## 2015-10-29 VITALS — BP 124/76 | HR 82 | Resp 20 | Ht 69.0 in | Wt 154.0 lb

## 2015-10-29 DIAGNOSIS — G2581 Restless legs syndrome: Secondary | ICD-10-CM | POA: Diagnosis not present

## 2015-10-29 DIAGNOSIS — R790 Abnormal level of blood mineral: Secondary | ICD-10-CM | POA: Diagnosis not present

## 2015-10-29 DIAGNOSIS — G4701 Insomnia due to medical condition: Secondary | ICD-10-CM | POA: Insufficient documentation

## 2015-10-29 MED ORDER — CARBIDOPA-LEVODOPA 25-100 MG PO TABS: ORAL_TABLET | ORAL | Status: DC
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
## 2016-01-12 DIAGNOSIS — E785 Hyperlipidemia, unspecified: Secondary | ICD-10-CM | POA: Diagnosis not present

## 2016-01-19 ENCOUNTER — Telehealth: Payer: Self-pay | Admitting: *Deleted

## 2016-01-19 ENCOUNTER — Other Ambulatory Visit: Payer: Self-pay | Admitting: Certified Nurse Midwife

## 2016-01-19 ENCOUNTER — Other Ambulatory Visit: Payer: Self-pay

## 2016-01-19 DIAGNOSIS — G2581 Restless legs syndrome: Secondary | ICD-10-CM

## 2016-01-19 MED ORDER — CARBIDOPA-LEVODOPA 25-100 MG PO TABS: ORAL_TABLET | ORAL | Status: DC

## 2016-01-19 MED ORDER — FLUTICASONE PROPIONATE 50 MCG/ACT NA SUSP: 1.0000 | Freq: Every day | NASAL | Status: DC

## 2016-01-19 NOTE — Telephone Encounter (Signed)
Medication refill request: Flonase Last AEX:  07-11-15 Next AEX: 07-14-16 Last MMG (if hormonal medication request): 12-12-14 WNL  Refill authorized: please advse  Sending to PG since DL is out of the office today

## 2016-01-19 NOTE — Telephone Encounter (Signed)
Patient requests refills of generic Flonase be sent to the pharmacy below. She is now retired and is Engineer, site. Patient said, "Please leave a message to confirm this has been done if I am not available when you call back[address] [place], [place]

## 2016-01-19 NOTE — Telephone Encounter (Signed)
RX for sinemet sent to [place] at [facility], received a receipt of confirmation.

## 2016-01-23 ENCOUNTER — Other Ambulatory Visit: Payer: Self-pay | Admitting: *Deleted

## 2016-01-23 NOTE — Telephone Encounter (Addendum)
Patient requesting refill of Vitamin D to [facility] on [address].  Best # to reach: [phone]  Medication refill request: Vitamin D 50,000 iu's  Last AEX:  07/11/15 with DL Next AEX: 07/14/16 with DL Last Vitamin D level check 07/04/2014 at 49 per Dr. Sabra Heck patient was okay to stay on prescription dose. Refill authorized: Please advise  Encounter closed in error.

## 2016-01-23 NOTE — Telephone Encounter (Signed)
Needs Vitamin D checked

## 2016-01-23 NOTE — Telephone Encounter (Signed)
Left Message To Call Back  

## 2016-01-23 NOTE — Addendum Note (Signed)
Addended by: Alfonzo Feller on: 01/23/2016 11:36 AM   Modules accepted: Orders

## 2016-01-26 NOTE — Telephone Encounter (Signed)
S/w patient she said she saw Dr. Harlan Stains at the beginning of the year and had full panel of blood work done. Called Dr. Orest Dikes office and they confirmed that patient had vitamin d level check in January and it was 45.5. Requested a copy be faxed to our office.  Please advise.

## 2016-01-28 MED ORDER — VITAMIN D (ERGOCALCIFEROL) 1.25 MG (50000 UNIT) PO CAPS: 50000.0000 [IU] | ORAL_CAPSULE | ORAL | Status: DC

## 2016-01-28 NOTE — Telephone Encounter (Signed)
Ok to refill with current level.

## 2016-01-28 NOTE — Telephone Encounter (Signed)
Erin Shepherd patient is calling to check on the status of her vitamin d rx, please advise.

## 2016-01-28 NOTE — Addendum Note (Signed)
Addended by: Regina Eck on: 01/28/2016 11:36 AM   Modules accepted: Orders

## 2016-01-28 NOTE — Telephone Encounter (Signed)
Patient aware that rx has been sent to pharmacy.  °

## 2016-02-02 DIAGNOSIS — R55 Syncope and collapse: Secondary | ICD-10-CM | POA: Diagnosis not present

## 2016-02-03 ENCOUNTER — Encounter: Payer: Self-pay | Admitting: Neurology

## 2016-02-03 ENCOUNTER — Ambulatory Visit (INDEPENDENT_AMBULATORY_CARE_PROVIDER_SITE_OTHER): Payer: PPO | Admitting: Neurology

## 2016-02-03 VITALS — BP 158/80 | HR 62 | Resp 20 | Ht 68.0 in | Wt 152.0 lb

## 2016-02-03 DIAGNOSIS — R55 Syncope and collapse: Secondary | ICD-10-CM

## 2016-02-03 MED ORDER — ESCITALOPRAM OXALATE 10 MG PO TABS: 10.0000 mg | ORAL_TABLET | Freq: Every day | ORAL | Status: DC
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
## 2016-02-03 NOTE — Patient Instructions (Signed)
Escitalopram tablets What is this medicine? ESCITALOPRAM (es sye TAL oh pram) is used to treat depression and certain types of anxiety. This medicine may be used for other purposes; ask your health care provider or pharmacist if you have questions. What should I tell my health care provider before I take this medicine? They need to know if you have any of these conditions: -bipolar disorder or a family history of bipolar disorder -diabetes -glaucoma -heart disease -kidney or liver disease -receiving electroconvulsive therapy -seizures (convulsions) -suicidal thoughts, plans, or attempt by you or a family member -an unusual or allergic reaction to escitalopram, the related drug citalopram, other medicines, foods, dyes, or preservatives -pregnant or trying to become pregnant -breast-feeding How should I use this medicine? Take this medicine by mouth with a glass of water. Follow the directions on the prescription label. You can take it with or without food. If it upsets your stomach, take it with food. Take your medicine at regular intervals. Do not take it more often than directed. Do not stop taking this medicine suddenly except upon the advice of your doctor. Stopping this medicine too quickly may cause serious side effects or your condition may worsen. A special MedGuide will be given to you by the pharmacist with each prescription and refill. Be sure to read this information carefully each time. Talk to your pediatrician regarding the use of this medicine in children. Special care may be needed. Overdosage: If you think you have taken too much of this medicine contact a poison control center or emergency room at once. NOTE: This medicine is only for you. Do not share this medicine with others. What if I miss a dose? If you miss a dose, take it as soon as you can. If it is almost time for your next dose, take only that dose. Do not take double or extra doses. What may interact with this  medicine? Do not take this medicine with any of the following medications: -certain medicines for fungal infections like fluconazole, itraconazole, ketoconazole, posaconazole, voriconazole -cisapride -citalopram -dofetilide -dronedarone -linezolid -MAOIs like Carbex, Eldepryl, Marplan, Nardil, and Parnate -methylene blue (injected into a vein) -pimozide -thioridazine -ziprasidone This medicine may also interact with the following medications: -alcohol -aspirin and aspirin-like medicines -carbamazepine -certain medicines for depression, anxiety, or psychotic disturbances -certain medicines for migraine headache like almotriptan, eletriptan, frovatriptan, naratriptan, rizatriptan, sumatriptan, zolmitriptan -certain medicines for sleep -certain medicines that treat or prevent blood clots like warfarin, enoxaparin, dalteparin -cimetidine -diuretics -fentanyl -furazolidone -isoniazid -lithium -metoprolol -NSAIDs, medicines for pain and inflammation, like ibuprofen or naproxen -other medicines that prolong the QT interval (cause an abnormal heart rhythm) -procarbazine -rasagiline -supplements like St. John's wort, kava kava, valerian -tramadol -tryptophan This list may not describe all possible interactions. Give your health care provider a list of all the medicines, herbs, non-prescription drugs, or dietary supplements you use. Also tell them if you smoke, drink alcohol, or use illegal drugs. Some items may interact with your medicine. What should I watch for while using this medicine? Tell your doctor if your symptoms do not get better or if they get worse. Visit your doctor or health care professional for regular checks on your progress. Because it may take several weeks to see the full effects of this medicine, it is important to continue your treatment as prescribed by your doctor. Patients and their families should watch out for new or worsening thoughts of suicide or  depression. Also watch out for sudden changes in feelings such  or health care professional for regular checks on your progress. Because it may take several weeks to see the full effects of this medicine, it is important to continue your treatment as prescribed by your doctor.  Patients and their families should watch out for new or worsening thoughts of suicide or  depression. Also watch out for sudden changes in feelings such as feeling anxious, agitated, panicky, irritable, hostile, aggressive, impulsive, severely restless, overly excited and hyperactive, or not being able to sleep. If this happens, especially at the beginning of treatment or after a change in dose, call your health care professional.  You may get drowsy or dizzy. Do not drive, use machinery, or do anything that needs mental alertness until you know how this medicine affects you. Do not stand or sit up quickly, especially if you are an older patient. This reduces the risk of dizzy or fainting spells. Alcohol may interfere with the effect of this medicine. Avoid alcoholic drinks.  Your mouth may get dry. Chewing sugarless gum or sucking hard candy, and drinking plenty of water may help. Contact your doctor if the problem does not go away or is severe.  What side effects may I notice from receiving this medicine?  Side effects that you should report to your doctor or health care professional as soon as possible:  -allergic reactions like skin rash, itching or hives, swelling of the face, lips, or tongue  -confusion  -feeling faint or lightheaded, falls  -fast talking and excited feelings or actions that are out of control  -hallucination, loss of contact with reality  -seizures  -suicidal thoughts or other mood changes  -unusual bleeding or bruising  Side effects that usually do not require medical attention (report to your doctor or health care professional if they continue or are bothersome):  -blurred vision  -changes in appetite  -change in sex drive or performance  -headache  -increased sweating  -nausea  This list may not describe all possible side effects. Call your doctor for medical advice about side effects. You may report side effects to FDA at 1-800-FDA-1088.  Where should I keep my medicine?  Keep out of reach of children.  Store at room temperature between 15 and 30 degrees C (59 and 86 degrees  F). Throw away any unused medicine after the expiration date.  NOTE: This sheet is a summary. It may not cover all possible information. If you have questions about this medicine, talk to your doctor, pharmacist, or health care provider.     © 2016, [employer]vier/[employer]. (2013-04-10 12:32:55)

## 2016-02-04 ENCOUNTER — Other Ambulatory Visit: Payer: Self-pay | Admitting: Neurology

## 2016-02-04 DIAGNOSIS — R55 Syncope and collapse: Secondary | ICD-10-CM

## 2016-02-05 ENCOUNTER — Telehealth: Payer: Self-pay | Admitting: Neurology

## 2016-02-05 MED ORDER — ALPRAZOLAM 0.5 MG PO TABS: 0.5000 mg | ORAL_TABLET | Freq: Every evening | ORAL | Status: DC | PRN

## 2016-02-05 NOTE — Telephone Encounter (Signed)
Spoke to pt and advised her that her xanax RX was faxed to [facility]. Per Dr. Edwena Felty conversation with me, pt may take one tablet 30 minutes prior to MRI, and if needed right before scan she may take one additional tablet. Pt must have a driver if she plans to take xanax for her MRI. Pt states that her husband will drive her. Pt verbalized understanding. Faxed to [facility]. Received a receipt of confirmation.

## 2016-02-05 NOTE — Telephone Encounter (Signed)
Xanax Prescription can be picked up at front desk. CD

## 2016-02-05 NOTE — Telephone Encounter (Signed)
Patient called requesting xanax for her MRI that is on 5/22 @ [facility]. Please call and advise. Patient can be reached @ [phone]

## 2016-02-09 ENCOUNTER — Telehealth: Payer: Self-pay | Admitting: Neurology

## 2016-02-09 NOTE — Telephone Encounter (Signed)
[facility] care management called and needs more information. Please call [phone]

## 2016-02-10 ENCOUNTER — Telehealth: Payer: Self-pay | Admitting: Neurology

## 2016-02-10 ENCOUNTER — Encounter: Payer: Self-pay | Admitting: *Deleted

## 2016-02-10 NOTE — Telephone Encounter (Signed)
Called and spoke with Erin Shepherd who stated that she would call me back when she determined the additional information that was needed.

## 2016-02-10 NOTE — Telephone Encounter (Signed)
RX for xanax was faxed to [facility] on 02/05/16 and a receipt of confirmation was received. I called pt to discuss. No answer, left a message asking her to call me back.

## 2016-02-10 NOTE — Telephone Encounter (Signed)
Erin Mirkin [id number]  SAME [id number] MRI THAT IS SCHEDULED ON  5/22

## 2016-02-10 NOTE — Telephone Encounter (Signed)
I spoke to pt and advised her that her xanax RX was sent to [place] at [facility] on 02/05/16 and a receipt of confirmation from [facility]. Pt says she called [facility] and they have no record of it. I advised pt that I will refax it. Pt verbalized understanding.  I refaxed the xanax RX and received a receipt of confirmation.

## 2016-02-16 ENCOUNTER — Ambulatory Visit
Admission: RE | Admit: 2016-02-16 | Discharge: 2016-02-16 | Disposition: A | Discharge status: Home / Self Care | Payer: PPO | Source: Ambulatory Visit | Attending: Neurology | Admitting: Neurology

## 2016-02-16 DIAGNOSIS — R55 Syncope and collapse: Secondary | ICD-10-CM | POA: Diagnosis not present

## 2016-02-16 MED ORDER — GADOBENATE DIMEGLUMINE 529 MG/ML IV SOLN
14.0000 mL | Freq: Once | INTRAVENOUS | Status: AC | PRN
  Administered 2016-02-16: 14 mL via INTRAVENOUS

## 2016-02-17 ENCOUNTER — Telehealth: Payer: Self-pay

## 2016-02-17 NOTE — Telephone Encounter (Signed)
Spoke to pt and advised her that per Dr. Brett Fairy, the MRI brain showed a small meningioma, and and older white matter lesion on her brainstem, which is not acute. No severe abnormalities seen.  Pt is very concerned about these results and is asking that Dr. Brett Fairy call her to discuss.  She can be reached before 5pm at [phone]. After 5pm, please call [phone].

## 2016-02-17 NOTE — Telephone Encounter (Signed)
I called pt to discuss MRI results. No answer, left a message asking her to call me back. 

## 2016-02-17 NOTE — Telephone Encounter (Signed)
Spoke to pt and advised her that her letters signed by Dr. Brett Fairy are ready for pick up at the front desk. Pt verbalized understanding.

## 2016-02-17 NOTE — Telephone Encounter (Signed)
Pt returned Kristen's call. She is going out this afternoon and to please call the cell number.
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
## 2016-02-17 NOTE — Telephone Encounter (Signed)
-----   Message from Larey Seat, MD sent at 02/17/2016 12:07 PM EDT ----- Small meningeoma 6 mm, an older white matter lesion at brainstem- this is not acute. No severe abnormalities.   FYI to PCP, CD

## 2016-04-12 ENCOUNTER — Telehealth: Payer: Self-pay | Admitting: Neurology

## 2016-04-12 DIAGNOSIS — G2581 Restless legs syndrome: Secondary | ICD-10-CM

## 2016-04-12 MED ORDER — CARBIDOPA-LEVODOPA 25-100 MG PO TABS: ORAL_TABLET | ORAL | Status: DC

## 2016-04-12 NOTE — Telephone Encounter (Signed)
Jinny Blossom, NP has never seen this patient. She will see pt for the first time on 04/28/2016.  The specific dosing instructions and order must come from Dr. Brett Fairy, including an increase in dose.

## 2016-04-12 NOTE — Telephone Encounter (Signed)
I spoke to pt. I advised her that Dr. Brett Fairy increased the sinemet to 2 tablets in the evening and 2 tablets after supper. Pt verbalized understanding. Pt asked that I send the RX to [facility] [address]. Received a receipt of confirmation.

## 2016-04-12 NOTE — Telephone Encounter (Signed)
i recommend to increase the dose .

## 2016-04-12 NOTE — Telephone Encounter (Signed)
I called pt to discuss increase in dose. No answer, left a message asking her to call me back.

## 2016-04-12 NOTE — Telephone Encounter (Signed)
Pt returned Kristen's call. Please call the home #.

## 2016-04-12 NOTE — Telephone Encounter (Signed)
I called pt to discuss. No answer, left a message asking her to call me back. 

## 2016-04-12 NOTE — Telephone Encounter (Signed)
Patient is calling and states the Rx carbidopa-levodopa 24-100 mg tablets is no longer helping her restless legs.  She states she wakes up several times a night and needs to talk about medication. Please call.

## 2016-04-12 NOTE — Telephone Encounter (Signed)
I called pt. She says that the carbidopa-levodopa 25-100mg  tablet (2 tablets in pm, 1 tablet after supper) is not effective any longer. She has also tried mirapex and requip which also are not effective any longer. Pt says that she will wake up with her legs bothering her and then it is impossible to go back to sleep. She says that she has tried different times for the sinemet but that has not helped either.  Pt has an appt on 04/28/2016 with Jinny Blossom, NP and is wondering what Dr. Brett Fairy thinks is best now for her RLS; is it another medication or increase in dosage of the sinemet?. She is concerned that her RLS is not going to get better.

## 2016-04-14 DIAGNOSIS — N3001 Acute cystitis with hematuria: Secondary | ICD-10-CM | POA: Diagnosis not present

## 2016-04-28 ENCOUNTER — Encounter: Payer: Self-pay | Admitting: Adult Health

## 2016-04-28 ENCOUNTER — Ambulatory Visit (INDEPENDENT_AMBULATORY_CARE_PROVIDER_SITE_OTHER): Payer: PPO | Admitting: Adult Health

## 2016-04-28 VITALS — BP 139/76 | HR 55 | Ht 68.0 in | Wt 155.0 lb

## 2016-04-28 DIAGNOSIS — G2581 Restless legs syndrome: Secondary | ICD-10-CM | POA: Diagnosis not present

## 2016-04-28 MED ORDER — ROPINIROLE HCL 0.25 MG PO TABS: 0.2500 mg | ORAL_TABLET | Freq: Every day | ORAL | 5 refills | Status: DC
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
## 2016-04-28 NOTE — Patient Instructions (Signed)
Try Requip 0.25 mg at bedtime Wean off sinemet Continue lexapro If your symptoms worsen or you develop new symptoms please let us know.

## 2016-04-29 NOTE — Progress Notes (Signed)
I agree with the assessment and plan as directed by NP .The patient is known to me .   Ayleen Mckinstry, MD  

## 2016-05-19 ENCOUNTER — Telehealth: Payer: Self-pay | Admitting: Adult Health

## 2016-05-19 DIAGNOSIS — G2581 Restless legs syndrome: Secondary | ICD-10-CM

## 2016-05-19 MED ORDER — CARBIDOPA-LEVODOPA 25-100 MG PO TABS: ORAL_TABLET | ORAL | 5 refills | Status: DC

## 2016-05-19 NOTE — Telephone Encounter (Signed)
Patient called to advise rOPINIRole (REQUIP) 0.25 MG tablet is not working. Also adds NP, Jinny Blossom recently increased Carbidopa-Levodopa, asks if there is an extended release for this to help with RLS during the night.

## 2016-05-19 NOTE — Telephone Encounter (Signed)
I called the patient. She states Requip did not work for she restart his Sinemet. Sinemet works great before bedtime however she wakes up in the middle the night her symptoms return and it takes approximately 1 hour to go back to sleep. She will increase Sinemet taking 2 tablets at 4 PM 2 tablets at 7 PM and one tablet before bedtime. If this is not beneficial in the future we may need to switch to extended release.

## 2016-05-19 NOTE — Telephone Encounter (Signed)
Spoke with pt.  She is taking carbidopa-levodopa 2 tabs at 1600 and 2 tabs at 1900.  Helps RLS until she wakes up to go to BR, then cannot go back to sleep.  Requip did not help. She is not taking this now.  (she took for 2 days after tapering off the carbidopa/levodopa).   Then she restarted the carbidopa/levadopa 25-100 tabs and is at the dose above. She wants to know if there is an ER type that may help her.

## 2016-05-20 NOTE — Telephone Encounter (Signed)
Received fax confirmation to [facility] [phone].

## 2016-07-08 ENCOUNTER — Encounter: Payer: Self-pay | Admitting: Neurology

## 2016-07-08 ENCOUNTER — Ambulatory Visit (INDEPENDENT_AMBULATORY_CARE_PROVIDER_SITE_OTHER): Payer: PPO | Admitting: Neurology

## 2016-07-08 DIAGNOSIS — G2581 Restless legs syndrome: Secondary | ICD-10-CM

## 2016-07-08 DIAGNOSIS — R55 Syncope and collapse: Secondary | ICD-10-CM | POA: Insufficient documentation

## 2016-07-08 MED ORDER — CARBIDOPA-LEVODOPA 25-100 MG PO TABS: ORAL_TABLET | ORAL | 5 refills | Status: DC
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
## 2016-07-08 NOTE — Patient Instructions (Signed)

## 2016-07-14 ENCOUNTER — Ambulatory Visit (INDEPENDENT_AMBULATORY_CARE_PROVIDER_SITE_OTHER): Payer: PPO | Admitting: Certified Nurse Midwife

## 2016-07-14 ENCOUNTER — Encounter: Payer: Self-pay | Admitting: Certified Nurse Midwife

## 2016-07-14 VITALS — BP 118/80 | HR 72 | Resp 16 | Ht 68.25 in | Wt 157.0 lb

## 2016-07-14 DIAGNOSIS — E559 Vitamin D deficiency, unspecified: Secondary | ICD-10-CM | POA: Diagnosis not present

## 2016-07-14 DIAGNOSIS — Z0000 Encounter for general adult medical examination without abnormal findings: Secondary | ICD-10-CM | POA: Diagnosis not present

## 2016-07-14 DIAGNOSIS — Z01419 Encounter for gynecological examination (general) (routine) without abnormal findings: Secondary | ICD-10-CM | POA: Diagnosis not present

## 2016-07-14 LAB — POCT URINALYSIS DIPSTICK
BILIRUBIN UA: NEGATIVE
Glucose, UA: NEGATIVE
KETONES UA: NEGATIVE
Leukocytes, UA: NEGATIVE
Nitrite, UA: NEGATIVE
Protein, UA: NEGATIVE
RBC UA: NEGATIVE
Urobilinogen, UA: NEGATIVE
pH, UA: 5

## 2016-07-14 NOTE — Progress Notes (Signed)
Scheduled patient while in office for left breast diagnostic mammogram and ultrasound at [facility] on 07/20/2016 at 10:15 am. Patient is agreeable to date and time. Placed in mammogram hold.

## 2016-07-14 NOTE — Progress Notes (Signed)
Encounter reviewed Wateen Varon, MD   

## 2016-07-14 NOTE — Progress Notes (Signed)
65 y.o. G1P1 Married  Caucasian Fe here for annual exam. Menopausal no HRT. Denies vaginal bleeding or vaginal dryness. Sees Dr. Dema Severin yearly with labs, /levo dopa management. Sees Dr. Orion Crook for Syncope and now back on Lexapro which has helped. No other health issues today. Taking Vitamin D due to deficiency, will recheck today. Planning trip to [place] soon. Retired this year.  Patient's last menstrual period was 09/28/1999.          Sexually active: Yes.    The current method of family planning is status post hysterectomy.    Exercising: Yes.    walking, climb steps, lift weights Smoker:  no  Health Maintenance: Pap:  04-24-13 neg MMG:  3/17 neg Colonoscopy:  2013 f/u 32yrs polyps BMD:   2011 pcp manages TDaP:  2012 Shingles: had done   Pneumonia: no Hep C and HIV: not done Labs: poct urine-neg Self breast exam: not done   reports that she has never smoked. She has never used smokeless tobacco. She reports that she drinks about 1.8 oz of alcohol per week . She reports that she does not use drugs.  Past Medical History:  Diagnosis Date  . Allergic rhinitis   . Arthritis   . Insomnia   . Osteopenia   . Restless leg syndrome   . 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 Squamous cell carcinoma 04/09/13   left collar bone-about one inch from area removed in 2006    Past Surgical History:  Procedure Laterality Date  . ABDOMINAL HYSTERECTOMY  2001  . BUNIONECTOMY    . squamous cell carcinoma removed  9/06   left collar bone  . squamous cell carcinoma removed  7/14   left collar bone  . tubes in ear     as a child    Current Outpatient Prescriptions  Medication Sig Dispense Refill  . aspirin 81 MG tablet Take 81 mg by mouth. 3 times a week    . carbidopa-levodopa (SINEMET) 25-100 MG tablet Take two tablets at 4 Pm and  2 tablets at 7PM and 1 tablet before bed. 150 tablet 5  . cyclobenzaprine (FLEXERIL) 10 MG tablet Take 1 tablet (10 mg total) by mouth  as needed. 30 tablet 2  . escitalopram (LEXAPRO) 10 MG tablet Take 1 tablet (10 mg total) by mouth daily. 30 tablet 5  . Vitamin D, Ergocalciferol, (DRISDOL) 50000 units CAPS capsule Take 1 capsule (50,000 Units total) by mouth every 14 (fourteen) days. Take one capsule every 14 days 12 capsule 4   No current facility-administered medications for this visit.     Family History  Problem Relation Age of Onset  . Emphysema Mother   . Asthma Mother   . Heart disease Mother   . Hypertension Mother     ROS:  Pertinent items are noted in HPI.  Otherwise, a comprehensive ROS was negative.  Exam:   BP 118/80   Pulse 72   Resp 16   Ht 5' 8.25" (1.734 m)   Wt 157 lb (71.2 kg)   LMP 09/28/1999   BMI 23.70 kg/m  Height: 5' 8.25" (173.4 cm) Ht Readings from Last 3 Encounters:  07/14/16 5' 8.25" (1.734 m)  07/08/16 5\' 8"  (1.727 m)  04/28/16 5\' 8"  (1.727 m)    General appearance: alert, cooperative and appears stated age Head: Normocephalic, without obvious abnormality, atraumatic Neck: no adenopathy, supple, symmetrical, trachea midline and thyroid normal to inspection and palpation Lungs: clear to  auscultation bilaterally Breasts: normal appearance, no masses or tenderness, No nipple retraction or dimpling, No nipple discharge or bleeding, No axillary or supraclavicular adenopathy, left breast bb size mass at 12o'clock at aerola edge Heart: regular rate and rhythm Abdomen: soft, non-tender; no masses,  no organomegaly Extremities: extremities normal, atraumatic, no cyanosis or edema Skin: Skin color, texture, turgor normal. No rashes or lesions Lymph nodes: Cervical, supraclavicular, and axillary nodes normal. No abnormal inguinal nodes palpated Neurologic: Grossly normal   Pelvic: External genitalia:  no lesions              Urethra:  normal appearing urethra with no masses, tenderness or lesions              Bartholin's and Skene's: normal                 Vagina: normal appearing  vagina with normal color and discharge, no lesions              Cervix: normal appearance, no masses or tenderness, No nipple retraction or dimpling, No nipple discharge or bleeding, No axillary or supraclavicular adenopathy, left breast at 12 o'clock areola edge bb feel mass,non tender, mobile              Pap taken: No. Bimanual Exam:  Uterus:  uterus absent              Adnexa: normal adnexa and no mass, fullness, tenderness               Rectovaginal: Confirms               Anus:  normal sphincter tone, no lesions  Chaperone present: yes  A:  Well Woman with normal exam  Menopausal no HRT  Left breast mass  Vitamin D deficiency P:   Reviewed health and wellness pertinent to exam  Aware of need to advise if vaginal bleeding  Discussed finding and need for further evaluation with diagnostic mammogram and [place]. Patient agreeable and will schedule prior to leaving today.  Lab: Vitamin D  Pap smear as above not taken   counseled on breast self exam, mammography screening, menopause, adequate intake of calcium and vitamin D, diet and exercise  return annually or prn  An After Visit Summary was printed and given to the patient.

## 2016-07-14 NOTE — Patient Instructions (Signed)

## 2016-07-15 ENCOUNTER — Other Ambulatory Visit: Payer: Self-pay | Admitting: Certified Nurse Midwife

## 2016-07-15 DIAGNOSIS — E559 Vitamin D deficiency, unspecified: Secondary | ICD-10-CM

## 2016-07-15 LAB — VITAMIN D 25 HYDROXY (VIT D DEFICIENCY, FRACTURES): Vit D, 25-Hydroxy: 48 ng/mL (ref 30–100)

## 2016-07-15 MED ORDER — VITAMIN D (ERGOCALCIFEROL) 1.25 MG (50000 UNIT) PO CAPS: 50000.0000 [IU] | ORAL_CAPSULE | ORAL | 4 refills | Status: DC

## 2016-07-20 DIAGNOSIS — N6321 Unspecified lump in the left breast, upper outer quadrant: Secondary | ICD-10-CM | POA: Diagnosis not present

## 2016-08-12 ENCOUNTER — Telehealth: Payer: Self-pay

## 2016-08-12 NOTE — Telephone Encounter (Signed)
Left message to call [place] at [phone].  Patient needs to schedule a 6 week follow up for a breast recheck with Melvia Heaps CNM.

## 2016-08-12 NOTE — Telephone Encounter (Signed)
Spoke with patient. 6 week breast recheck scheduled for 08/26/2016 at 10 am with Melvia Heaps CNM. Patient is agreeable to date and time.  Routing to provider for final review. Patient agreeable to disposition. Will close encounter.

## 2016-08-26 ENCOUNTER — Ambulatory Visit: Payer: PPO | Admitting: Certified Nurse Midwife

## 2016-08-31 ENCOUNTER — Encounter: Payer: Self-pay | Admitting: Certified Nurse Midwife

## 2016-08-31 ENCOUNTER — Telehealth: Payer: Self-pay | Admitting: Neurology

## 2016-08-31 ENCOUNTER — Ambulatory Visit (INDEPENDENT_AMBULATORY_CARE_PROVIDER_SITE_OTHER): Payer: PPO | Admitting: Certified Nurse Midwife

## 2016-08-31 VITALS — BP 124/70 | HR 70 | Resp 16 | Ht 68.25 in | Wt 157.0 lb

## 2016-08-31 DIAGNOSIS — G2581 Restless legs syndrome: Secondary | ICD-10-CM

## 2016-08-31 DIAGNOSIS — N6320 Unspecified lump in the left breast, unspecified quadrant: Secondary | ICD-10-CM | POA: Diagnosis not present

## 2016-08-31 DIAGNOSIS — R55 Syncope and collapse: Secondary | ICD-10-CM

## 2016-08-31 MED ORDER — CARBIDOPA-LEVODOPA 25-100 MG PO TABS: ORAL_TABLET | ORAL | 5 refills | Status: DC

## 2016-08-31 MED ORDER — ESCITALOPRAM OXALATE 10 MG PO TABS: 10.0000 mg | ORAL_TABLET | Freq: Every day | ORAL | 5 refills | Status: DC

## 2016-08-31 NOTE — Telephone Encounter (Signed)
Pt request refill for carbidopa-levodopa (SINEMET) 25-100 MG tablet and escitalopram (LEXAPRO) 10 MG tablet sent to [facility]- this is current pharmacy

## 2016-08-31 NOTE — Telephone Encounter (Signed)
Rx sent to [facility]. Patient up to date on appts and Rx due.

## 2016-08-31 NOTE — Progress Notes (Signed)
65 y.o. Married Caucasian female G1P1 here for follow up of left breast mass noted at aex on10/18/17. Patient had not noted any changes. Mammogram was done 07/20/16 and no masses or cystic masses were noted. Scattered fibroglandular tissue was noted. [place] noted same. Patient was able to feel area but feels the same as the other breast. No nipple discharge or skin change. No tenderness. Does SBE.    O: Healthy WD,WN female Affect: normal  Skin:warm and dry Breasts exam : right breast and left breasts, no tenderness, skin change or masses noted, no nipple discharge. Area of concern when palpated with patient sitting feels fibroglandular, no single mass. Axillary nodes: no enlargement   A:Normal breast exam with fibroglandular density and no single mass noted in left breast.  P: Discussed findings of with patient and feel this is related to menopausal changes of breast tissue. Discussed importance of SBE. Patient would like to have recheck in one month, agreeable with provider. Will advise if any changes.   RV prn, as above

## 2016-09-02 ENCOUNTER — Encounter: Payer: Self-pay | Admitting: Certified Nurse Midwife

## 2016-09-02 NOTE — Progress Notes (Signed)
Encounter reviewed Jill Jertson, MD   

## 2016-10-01 ENCOUNTER — Encounter: Payer: Self-pay | Admitting: Certified Nurse Midwife

## 2016-10-01 ENCOUNTER — Ambulatory Visit (INDEPENDENT_AMBULATORY_CARE_PROVIDER_SITE_OTHER): Payer: PPO | Admitting: Certified Nurse Midwife

## 2016-10-01 VITALS — BP 122/80 | Resp 16 | Ht 68.25 in | Wt 157.0 lb

## 2016-10-01 DIAGNOSIS — Z1239 Encounter for other screening for malignant neoplasm of breast: Secondary | ICD-10-CM

## 2016-10-01 DIAGNOSIS — Z1231 Encounter for screening mammogram for malignant neoplasm of breast: Secondary | ICD-10-CM | POA: Diagnosis not present

## 2016-10-01 NOTE — Progress Notes (Signed)
   Subjective:   66 y.o. MarriedCaucasian female presents for recheck of left breast. Was seen for ? breast mass at aex on 07/14/16. Diagnostic mammogram and [place] were negative for any malignancy findings, only fibroglandular tissue noted. Patient seen for recheck in 08/31/16 with normal breast exam and no mass noted. Here today with spouse to recheck, just concerned. Does not do regular SBE. Patient denies feeling area now or any nipple discharge or skin change. No other health changes.  Review of Systems Pertinent items are noted in HPI.   Objective:   General appearance: alert, cooperative, appears stated age and no distress Breasts: normal appearance, no masses or tenderness, No nipple retraction or dimpling, No nipple discharge or bleeding, No axillary or supraclavicular adenopathy, no breast mass noted in area of concern, just fibroglandular feel only.   Assessment:   ASSESSMENT:Patient is diagnosed with normal breast exam   Plan:   PLAN: The patient will continue SBE and advise if any changes. Routine mammogram when due.  Rv prn

## 2016-10-01 NOTE — Patient Instructions (Signed)
Breast Self-Awareness Introduction Breast self-awareness means:  Knowing how your breasts look.  Knowing how your breasts feel.  Checking your breasts every month for changes.  Telling your doctor if you notice a change in your breasts. Breast self-awareness allows you to notice a breast problem early while it is still small. How to do a breast self-exam One way to learn what is normal for your breasts and to check for changes is to do a breast self-exam. To do a breast self-exam: Look for Changes  1. Take off all the clothes above your waist. 2. Stand in front of a mirror in a room with good lighting. 3. Put your hands on your hips. 4. Push your hands down. 5. Look at your breasts and nipples in the mirror to see if one breast or nipple looks different than the other. Check to see if:  The shape of one breast is different.  The size of one breast is different.  There are wrinkles, dips, and bumps in one breast and not the other. 6. Look at each breast for changes in your skin, such as:  Redness.  Scaly areas. 7. Look for changes in your nipples, such as:  Liquid around the nipples.  Bleeding.  Dimpling.  Redness.  A change in where the nipples are. Feel for Changes 1. Lie on your back on the floor. 2. Feel each breast. To do this, follow these steps:  Pick a breast to feel.  Put the arm closest to that breast above your head.  Use your other arm to feel the nipple area of your breast. Feel the area with the pads of your three middle fingers by making small circles with your fingers. For the first circle, press lightly. For the second circle, press harder. For the third circle, press even harder.  Keep making circles with your fingers at the light, harder, and even harder pressures as you move down your breast. Stop when you feel your ribs.  Move your fingers a little toward the center of your body.  Start making circles with your fingers again, this time going  up until you reach your collarbone.  Keep making up and down circles until you reach your armpit. Remember to keep using the three pressures.  Feel the other breast in the same way. 3. Sit or stand in the shower or tub. 4. With soapy water on your skin, feel each breast the same way you did in step 2, when you were lying on the floor. Write Down What You Find  After doing the self-exam, write down:  What is normal for each breast.  Any changes you find in each breast.  When you last had your period. How often should I check my breasts? Check your breasts every month. If you are breastfeeding, the best time to check them is after you feed your baby or after you use a breast pump. If you get periods, the best time to check your breasts is 5-7 days after your period is over. When should I see my doctor? See your doctor if you notice:  A change in shape or size of your breasts or nipples.  A change in the skin of your breast or nipples, such as red or scaly skin.  Unusual fluid coming from your nipples.  A lump or thick area that was not there before.  Pain in your breasts.  Anything that concerns you. This information is not intended to replace advice given to you by  your health care provider. Make sure you discuss any questions you have with your health care provider. Document Released: 03/01/2008 Document Revised: 02/19/2016 Document Reviewed: 08/03/2015  2017 Elsevier

## 2016-10-09 NOTE — Progress Notes (Signed)
Encounter reviewed Erin Mcglaughlin, MD   

## 2016-12-09 DIAGNOSIS — Z86010 Personal history of colonic polyps: Secondary | ICD-10-CM | POA: Diagnosis not present

## 2016-12-09 DIAGNOSIS — D120 Benign neoplasm of cecum: Secondary | ICD-10-CM | POA: Diagnosis not present

## 2016-12-09 DIAGNOSIS — K5730 Diverticulosis of large intestine without perforation or abscess without bleeding: Secondary | ICD-10-CM | POA: Diagnosis not present

## 2016-12-09 DIAGNOSIS — D125 Benign neoplasm of sigmoid colon: Secondary | ICD-10-CM | POA: Diagnosis not present

## 2016-12-14 DIAGNOSIS — Z86010 Personal history of colonic polyps: Secondary | ICD-10-CM | POA: Diagnosis not present

## 2016-12-14 DIAGNOSIS — D125 Benign neoplasm of sigmoid colon: Secondary | ICD-10-CM | POA: Diagnosis not present

## 2016-12-14 DIAGNOSIS — K5730 Diverticulosis of large intestine without perforation or abscess without bleeding: Secondary | ICD-10-CM | POA: Diagnosis not present

## 2016-12-14 DIAGNOSIS — D120 Benign neoplasm of cecum: Secondary | ICD-10-CM | POA: Diagnosis not present

## 2016-12-15 DIAGNOSIS — Z1231 Encounter for screening mammogram for malignant neoplasm of breast: Secondary | ICD-10-CM | POA: Diagnosis not present

## 2016-12-16 DIAGNOSIS — L821 Other seborrheic keratosis: Secondary | ICD-10-CM | POA: Diagnosis not present

## 2016-12-16 DIAGNOSIS — L570 Actinic keratosis: Secondary | ICD-10-CM | POA: Diagnosis not present

## 2016-12-16 DIAGNOSIS — D485 Neoplasm of uncertain behavior of skin: Secondary | ICD-10-CM | POA: Diagnosis not present

## 2016-12-16 DIAGNOSIS — Z85828 Personal history of other malignant neoplasm of skin: Secondary | ICD-10-CM | POA: Diagnosis not present

## 2016-12-27 DIAGNOSIS — F458 Other somatoform disorders: Secondary | ICD-10-CM | POA: Diagnosis not present

## 2016-12-27 DIAGNOSIS — K219 Gastro-esophageal reflux disease without esophagitis: Secondary | ICD-10-CM | POA: Diagnosis not present

## 2017-01-21 DIAGNOSIS — K210 Gastro-esophageal reflux disease with esophagitis: Secondary | ICD-10-CM | POA: Diagnosis not present

## 2017-03-14 DIAGNOSIS — H524 Presbyopia: Secondary | ICD-10-CM | POA: Diagnosis not present

## 2017-03-14 DIAGNOSIS — H5212 Myopia, left eye: Secondary | ICD-10-CM | POA: Diagnosis not present

## 2017-03-14 DIAGNOSIS — H2513 Age-related nuclear cataract, bilateral: Secondary | ICD-10-CM | POA: Diagnosis not present

## 2017-03-16 DIAGNOSIS — H16203 Unspecified keratoconjunctivitis, bilateral: Secondary | ICD-10-CM | POA: Diagnosis not present

## 2017-03-16 DIAGNOSIS — H1045 Other chronic allergic conjunctivitis: Secondary | ICD-10-CM | POA: Diagnosis not present

## 2017-03-23 DIAGNOSIS — J0100 Acute maxillary sinusitis, unspecified: Secondary | ICD-10-CM | POA: Diagnosis not present

## 2017-03-29 DIAGNOSIS — Z1389 Encounter for screening for other disorder: Secondary | ICD-10-CM | POA: Diagnosis not present

## 2017-03-29 DIAGNOSIS — J329 Chronic sinusitis, unspecified: Secondary | ICD-10-CM | POA: Diagnosis not present

## 2017-04-11 ENCOUNTER — Other Ambulatory Visit: Payer: Self-pay | Admitting: Neurology

## 2017-05-13 ENCOUNTER — Other Ambulatory Visit: Payer: Self-pay | Admitting: Neurology

## 2017-05-13 DIAGNOSIS — G2581 Restless legs syndrome: Secondary | ICD-10-CM

## 2017-05-13 DIAGNOSIS — R55 Syncope and collapse: Secondary | ICD-10-CM

## 2017-05-13 NOTE — Telephone Encounter (Signed)
Left VM for Erin Shepherd. CD

## 2017-05-23 DIAGNOSIS — H6992 Unspecified Eustachian tube disorder, left ear: Secondary | ICD-10-CM | POA: Diagnosis not present

## 2017-05-23 DIAGNOSIS — H6522 Chronic serous otitis media, left ear: Secondary | ICD-10-CM | POA: Diagnosis not present

## 2017-05-23 DIAGNOSIS — J018 Other acute sinusitis: Secondary | ICD-10-CM | POA: Diagnosis not present

## 2017-07-12 ENCOUNTER — Ambulatory Visit: Payer: PPO | Admitting: Neurology

## 2017-07-28 ENCOUNTER — Telehealth: Payer: Self-pay | Admitting: Neurology

## 2017-07-28 NOTE — Telephone Encounter (Signed)
Patient states that she is going to need a refill on her medication for restless leg syndrome and lexapro. She could not get a follow up apt until January and is worried she will run out of refills. Please call and advise.

## 2017-07-29 ENCOUNTER — Other Ambulatory Visit: Payer: Self-pay | Admitting: Neurology

## 2017-07-29 MED ORDER — ESCITALOPRAM OXALATE 10 MG PO TABS: 10.0000 mg | ORAL_TABLET | Freq: Every day | ORAL | 3 refills | Status: DC

## 2017-07-29 NOTE — Telephone Encounter (Signed)
Spoke with the patient and made her aware that all is taken care of. The patient uses [facility] and I have re corrected the script and sent it to her correct pharmacy. Pt verbalized understanding.

## 2017-07-29 NOTE — Telephone Encounter (Signed)
Called the patient back to make her aware that I have sent in a refill to last her until jan for her lexapro. The sinemet that she takes has enough refills on it to get her until her apt in [place] as it was previously written in Aug with 5 refills. LVM with this information since pt didn't answer and instructed to call back if she had any concerns.

## 2017-07-29 NOTE — Telephone Encounter (Signed)
Pt called back for RN Myriam Jacobson, I relayed message to her and the call some how on the end of the Pt went dead.  I did state that her medications should be updated and should last her until her apt in [place].  Not sure if she heard me due to what went wrong with her phone

## 2017-07-30 ENCOUNTER — Other Ambulatory Visit: Payer: Self-pay | Admitting: Certified Nurse Midwife

## 2017-07-30 DIAGNOSIS — E559 Vitamin D deficiency, unspecified: Secondary | ICD-10-CM

## 2017-08-01 NOTE — Telephone Encounter (Signed)
Will recheck on her aex and then refill if needed.

## 2017-08-01 NOTE — Telephone Encounter (Signed)
Medication refill request: Vitamin D  Last AEX:  07-14-16 Next AEX: 08-02-17  Last MMG (if hormonal medication request): 12-15-16 WNL  Refill authorized: please advise

## 2017-08-02 ENCOUNTER — Encounter: Payer: Self-pay | Admitting: Certified Nurse Midwife

## 2017-08-02 ENCOUNTER — Ambulatory Visit (INDEPENDENT_AMBULATORY_CARE_PROVIDER_SITE_OTHER): Payer: PPO | Admitting: Certified Nurse Midwife

## 2017-08-02 VITALS — BP 110/68 | HR 70 | Resp 16 | Ht 68.0 in | Wt 168.0 lb

## 2017-08-02 DIAGNOSIS — N951 Menopausal and female climacteric states: Secondary | ICD-10-CM

## 2017-08-02 DIAGNOSIS — Z01419 Encounter for gynecological examination (general) (routine) without abnormal findings: Secondary | ICD-10-CM | POA: Diagnosis not present

## 2017-08-02 DIAGNOSIS — E559 Vitamin D deficiency, unspecified: Secondary | ICD-10-CM

## 2017-08-02 NOTE — Progress Notes (Signed)
66 y.o. G1P1 Married  Caucasian Fe here for annual exam. Menopausal no HRT.  Denies vaginal bleeding or vaginal dryness. Sees PCP Harlan Stains yearly for restless management/anxiety and due for exam.Has labs there, Hep C was screened in 2015 negative. Has been taking Vitamin D, due for recheck today. Spouse with lung issues and is doing poorly. Previous smoker and patient has social stress with this occurrence.  No other health issues today.  Patient's last menstrual period was 09/28/1999.          Sexually active: Yes.    The current method of family planning is status post hysterectomy.    Exercising: Yes.    walk, run Smoker:  no  Health Maintenance: Pap:  04-24-13 neg History of Abnormal Pap: no MMG:  12-15-16 category b density birads 1:neg Self Breast exams: no Colonoscopy: 2017 f/u 29yrs BMD:   2011 pcp ordered TDaP:  2012 Shingles: had done Pneumonia: none Hep C and HIV: Hep c in 2015 neg at PCP. Labs: if needed   reports that  has never smoked. she has never used smokeless tobacco. She reports that she drinks about 3.0 oz of alcohol per week. She reports that she does not use drugs.  Past Medical History:  Diagnosis Date  . Allergic rhinitis   . Arthritis   . Insomnia   . Osteopenia   . Restless leg syndrome   . 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 Squamous cell carcinoma 04/09/13   left collar bone-about one inch from area removed in 2006    Past Surgical History:  Procedure Laterality Date  . ABDOMINAL HYSTERECTOMY  2001  . BUNIONECTOMY    . squamous cell carcinoma removed  9/06   left collar bone  . squamous cell carcinoma removed  7/14   left collar bone  . tubes in ear     as a child    Current Outpatient Medications  Medication Sig Dispense Refill  . aspirin 81 MG tablet Take 81 mg by mouth. 3 times a week    . BIOTIN PO Take by mouth.    . carbidopa-levodopa (SINEMET IR) 25-100 MG tablet TAKE 2 TABLETS BY MOUTH AT 4 PM AND 2 TABLETS AT  7 PM AND 1 TABLET BEFORE BED 150 tablet 5  . cetirizine (ZYRTEC) 10 MG tablet Take 10 mg daily by mouth.    . escitalopram (LEXAPRO) 10 MG tablet Take 1 tablet (10 mg total) by mouth daily. 30 tablet 3  . Vitamin D, Ergocalciferol, (DRISDOL) 50000 units CAPS capsule Take 1 capsule (50,000 Units total) by mouth every 14 (fourteen) days. Take one capsule every 14 days 12 capsule 4   No current facility-administered medications for this visit.     Family History  Problem Relation Age of Onset  . Emphysema Mother   . Asthma Mother   . Heart disease Mother   . Hypertension Mother     ROS:  Pertinent items are noted in HPI.  Otherwise, a comprehensive ROS was negative.  Exam:   BP 110/68   Pulse 70   Resp 16   Ht 5\' 8"  (1.727 m)   Wt 168 lb (76.2 kg)   LMP 09/28/1999   BMI 25.54 kg/m  Height: 5\' 8"  (172.7 cm) Ht Readings from Last 3 Encounters:  08/02/17 5\' 8"  (1.727 m)  10/01/16 5' 8.25" (1.734 m)  08/31/16 5' 8.25" (1.734 m)    General appearance: alert, cooperative and appears stated age Head:  Normocephalic, without obvious abnormality, atraumatic Neck: no adenopathy, supple, symmetrical, trachea midline and thyroid normal to inspection and palpation Lungs: clear to auscultation bilaterally Breasts: normal appearance, no masses or tenderness, No nipple retraction or dimpling, No nipple discharge or bleeding, No axillary or supraclavicular adenopathy Heart: regular rate and rhythm Abdomen: soft, non-tender; no masses,  no organomegaly Extremities: extremities normal, atraumatic, no cyanosis or edema Skin: Skin color, texture, turgor normal. No rashes or lesions Lymph nodes: Cervical, supraclavicular, and axillary nodes normal. No abnormal inguinal nodes palpated Neurologic: Grossly normal   Pelvic: External genitalia:  no lesions              Urethra:  normal appearing urethra with no masses, tenderness or lesions              Bartholin's and Skene's: normal                  Vagina: normal appearing vagina with normal color and discharge, no lesions              Cervix: absent              Pap taken: No. Bimanual Exam:  Uterus:  uterus absent              Adnexa: normal adnexa and no mass, fullness, tenderness               Rectovaginal: Confirms               Anus:  normal sphincter tone, no lesions  Chaperone present: yes  A:  Well Woman with normal exam  Menopausal no HRT S/P TAH for bleeding ovaries retained  Restless syndrome with insomnia with PCP management  Anxiety with Lexapro use with PCP management  History of Vitamin D deficiency recheck due today  BMD due    P:   Reviewed health and wellness pertinent to exam  Discussed need for follow up with PCP as indicated.  Lab: Vitamin D  And will advise if supplement needed once results in  Patient will schedule BMD with next mammogram.  Pap smear: no   counseled on breast self exam, mammography screening, feminine hygiene, adequate intake of calcium and vitamin D, diet and exercise return annually or prn  An After Visit Summary was printed and given to the patient.

## 2017-08-02 NOTE — Patient Instructions (Signed)
EXERCISE AND DIET:  We recommended that you start or continue a regular exercise program for good health. Regular exercise means any activity that makes your heart beat faster and makes you sweat.  We recommend exercising at least 30 minutes per day at least 3 days a week, preferably 4 or 5.  We also recommend a diet low in fat and sugar.  Inactivity, poor dietary choices and obesity can cause diabetes, heart attack, stroke, and kidney damage, among others.    ALCOHOL AND SMOKING:  Women should limit their alcohol intake to no more than 7 drinks/beers/glasses of wine (combined, not each!) per week. Moderation of alcohol intake to this level decreases your risk of breast cancer and liver damage. And of course, no recreational drugs are part of a healthy lifestyle.  And absolutely no smoking or even second hand smoke. Most people know smoking can cause heart and lung diseases, but did you know it also contributes to weakening of your bones? Aging of your skin?  Yellowing of your teeth and nails?  CALCIUM AND VITAMIN D:  Adequate intake of calcium and Vitamin D are recommended.  The recommendations for exact amounts of these supplements seem to change often, but generally speaking 600 mg of calcium (either carbonate or citrate) and 800 units of Vitamin D per day seems prudent. Certain women may benefit from higher intake of Vitamin D.  If you are among these women, your doctor will have told you during your visit.    PAP SMEARS:  Pap smears, to check for cervical cancer or precancers,  have traditionally been done yearly, although recent scientific advances have shown that most women can have pap smears less often.  However, every woman still should have a physical exam from her gynecologist every year. It will include a breast check, inspection of the vulva and vagina to check for abnormal growths or skin changes, a visual exam of the cervix, and then an exam to evaluate the size and shape of the uterus and  ovaries.  And after 66 years of age, a rectal exam is indicated to check for rectal cancers. We will also provide age appropriate advice regarding health maintenance, like when you should have certain vaccines, screening for sexually transmitted diseases, bone density testing, colonoscopy, mammograms, etc.   MAMMOGRAMS:  All women over 40 years old should have a yearly mammogram. Many facilities now offer a "3D" mammogram, which may cost around $50 extra out of pocket. If possible,  we recommend you accept the option to have the 3D mammogram performed.  It both reduces the number of women who will be called back for extra views which then turn out to be normal, and it is better than the routine mammogram at detecting truly abnormal areas.    COLONOSCOPY:  Colonoscopy to screen for colon cancer is recommended for all women at age 50.  We know, you hate the idea of the prep.  We agree, BUT, having colon cancer and not knowing it is worse!!  Colon cancer so often starts as a polyp that can be seen and removed at colonscopy, which can quite literally save your life!  And if your first colonoscopy is normal and you have no family history of colon cancer, most women don't have to have it again for 10 years.  Once every ten years, you can do something that may end up saving your life, right?  We will be happy to help you get it scheduled when you are ready.    Be sure to check your insurance coverage so you understand how much it will cost.  It may be covered as a preventative service at no cost, but you should check your particular policy.      Atrophic Vaginitis Atrophic vaginitis is when the tissues that line the vagina become dry and thin. This is caused by a drop in estrogen. Estrogen helps:  To keep the vagina moist.  To make a clear fluid that helps: ? To lubricate the vagina for sex. ? To protect the vagina from infection.  If the lining of the vagina is dry and thin, it may:  Make sex painful. It  may also cause bleeding.  Cause a feeling of: ? Burning. ? Irritation. ? Itchiness.  Make an exam of your vagina painful. It may also cause bleeding.  Make you lose interest in sex.  Cause a burning feeling when you pee.  Make your vaginal fluid (discharge) brown or yellow.  For some women, there are no symptoms. This condition is most common in women who do not get their regular menstrual periods anymore (menopause). This often starts when a woman is 45-55 years old. Follow these instructions at home:  Take medicines only as told by your doctor. Do not use any herbal or alternative medicines unless your doctor says it is okay.  Use over-the-counter products for dryness only as told by your doctor. These include: ? Creams. ? Lubricants. ? Moisturizers.  Do not douche.  Do not use products that can make your vagina dry. These include: ? Scented feminine sprays. ? Scented tampons. ? Scented soaps.  If it hurts to have sex, tell your sexual partner. Contact a doctor if:  Your discharge looks different than normal.  Your vagina has an unusual smell.  You have new symptoms.  Your symptoms do not get better with treatment.  Your symptoms get worse. This information is not intended to replace advice given to you by your health care provider. Make sure you discuss any questions you have with your health care provider. Document Released: 03/01/2008 Document Revised: 02/19/2016 Document Reviewed: 09/04/2014 Elsevier Interactive Patient Education  2018 Elsevier Inc.  

## 2017-08-03 LAB — VITAMIN D 25 HYDROXY (VIT D DEFICIENCY, FRACTURES): VIT D 25 HYDROXY: 41.3 ng/mL (ref 30.0–100.0)

## 2017-08-23 ENCOUNTER — Ambulatory Visit (INDEPENDENT_AMBULATORY_CARE_PROVIDER_SITE_OTHER): Payer: PPO | Admitting: Orthopedic Surgery

## 2017-08-23 ENCOUNTER — Telehealth: Payer: Self-pay | Admitting: Certified Nurse Midwife

## 2017-08-23 ENCOUNTER — Ambulatory Visit (INDEPENDENT_AMBULATORY_CARE_PROVIDER_SITE_OTHER): Payer: PPO

## 2017-08-23 ENCOUNTER — Encounter (INDEPENDENT_AMBULATORY_CARE_PROVIDER_SITE_OTHER): Payer: Self-pay | Admitting: Orthopedic Surgery

## 2017-08-23 VITALS — Ht 68.0 in | Wt 168.0 lb

## 2017-08-23 DIAGNOSIS — M25511 Pain in right shoulder: Secondary | ICD-10-CM

## 2017-08-23 DIAGNOSIS — G8929 Other chronic pain: Secondary | ICD-10-CM

## 2017-08-23 DIAGNOSIS — M545 Low back pain, unspecified: Secondary | ICD-10-CM

## 2017-08-23 MED ORDER — PREDNISONE 10 MG PO TABS: 20.0000 mg | ORAL_TABLET | Freq: Every day | ORAL | 0 refills | Status: DC

## 2017-08-23 NOTE — Telephone Encounter (Signed)
Spoke with patient. Patient states that she has been having trouble with her back and has been taking an old Flexeril rx that was given to her by [facility]. Asking for a refill. Advised will need to seek evaluation with PCP. Patient is going to see an orthopaedic MD today and will speak with him about the rx.  Routing to provider for final review. Patient agreeable to disposition. Will close encounter.

## 2017-08-23 NOTE — Progress Notes (Signed)
Office Visit Note   Patient: Erin Shepherd           Date of Birth: September 26, 1951           MRN: [id number] Visit Date: 08/23/2017              Requested by: Harlan Stains, MD [place], [place] [address] PCP: Harlan Stains, MD  Chief Complaint  Patient presents with  . Right Shoulder - Pain  . Lower Back - Pain      HPI: Patient is a 66 year old woman with a chronic history of lumbar spine pain as well as some right shoulder pain.  Patient states she has pain with abduction and flexion denies any restriction with the range of motion.  Patient states her shoulder became painful after lifting some heavy rocks over the summer.  Patient states that her flareup of her back pain is a 10 out of 10 she denies any radicular symptoms.  She states it does feel better with stretching and exercise.  She has been using diclofenac as well as Flexeril.  Assessment & Plan: Visit Diagnoses:  1. Acute right-sided low back pain without sciatica   2. Chronic right shoulder pain     Plan: Recommended a low-dose prednisone 20 mg with breakfast she will wean off this after several weeks.  Recommended heat for her back recommend exercise for her back and shoulder.  Patient states she would like to follow-up as needed.  Follow-Up Instructions: Return if symptoms worsen or fail to improve.   Ortho Exam  Patient is alert, oriented, no adenopathy, well-dressed, normal affect, normal respiratory effort. Examination patient has a normal gait.  She has full range of motion of the right shoulder compared to the left.  She has mild pain with Neer and Hawkins impingement test.  The biceps tendon is minimally tender to palpation the [facility] joint is nontender to palpation.  She has good range of motion no adhesive capsulitis.  Examination of the lumbar spine she has no radicular symptoms she has a negative straight leg raise bilaterally no pain with range of motion of the hip knee or ankle no  focal motor deficit in either lower extremity.  Imaging: Xr Lumbar Spine 2-3 Views  Result Date: 08/23/2017 2 view radiographs of the lumbar spine shows a grade 1 spondylolisthesis at L5-S1 with osteophytic bone spurs throughout the lumbar spine with joint space narrowing.  Patient has no scoliosis no compression fracture.  Xr Shoulder Right  Result Date: 08/23/2017 2 view radiographs of the right shoulder shows a congruent glenohumeral space lung field is clear axillary view shows no dislocation or subluxation.  No images are attached to the encounter.  Labs: Lab Results  Component Value Date   LABORGA ESCHERICHIA COLI 09/23/2012    @LABSALLVALUES (HGBA1)@  @BMI1 @  Orders:  Orders Placed This Encounter  Procedures  . XR Shoulder Right  . XR Lumbar Spine 2-3 Views   No orders of the defined types were placed in this encounter.    Procedures: No procedures performed  Clinical Data: No additional findings.  ROS:  All other systems negative, except as noted in the HPI. Review of Systems  Objective: Vital Signs: Ht 5\' 8"  (1.727 m)   Wt 168 lb (76.2 kg)   LMP 09/28/1999   BMI 25.54 kg/m   Specialty Comments:  No specialty comments available.  PMFS History: Patient Active Problem List   Diagnosis Date Noted  . Syncope 07/08/2016  .  Low serum ferritin level 10/29/2015  . Insomnia due to medical condition 10/29/2015  . RLS (restless legs syndrome) 07/10/2015  . RESTLESS LEGS SYNDROME 08/02/2008  . ALLERGIC RHINITIS 08/01/2008  . INSOMNIA 08/01/2008   Past Medical History:  Diagnosis Date  . Allergic rhinitis   . Arthritis   . Insomnia   . Osteopenia   . Restless leg syndrome   . 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 Squamous cell carcinoma 04/09/13   left collar bone-about one inch from area removed in 2006    Family History  Problem Relation Age of Onset  . Emphysema Mother   . Asthma Mother   . Heart disease Mother   .  Hypertension Mother     Past Surgical History:  Procedure Laterality Date  . ABDOMINAL HYSTERECTOMY  2001  . BUNIONECTOMY    . squamous cell carcinoma removed  9/06   left collar bone  . squamous cell carcinoma removed  7/14   left collar bone  . tubes in ear     as a child   Social History   Occupational History  . Not on file  Tobacco Use  . Smoking status: Never Smoker  . Smokeless tobacco: Never Used  Substance and Sexual Activity  . Alcohol use: Yes    Alcohol/week: 3.0 oz    Types: 5 Standard drinks or equivalent per week  . Drug use: No  . Sexual activity: Yes    Partners: Male    Birth control/protection: Surgical    Comment: TAH

## 2017-08-23 NOTE — Telephone Encounter (Signed)
Patient requesting a script for Flexeril to have on hand if she needs it.

## 2017-10-12 ENCOUNTER — Ambulatory Visit: Payer: PPO | Admitting: Neurology

## 2017-10-12 ENCOUNTER — Encounter: Payer: Self-pay | Admitting: Neurology

## 2017-10-12 DIAGNOSIS — G2581 Restless legs syndrome: Secondary | ICD-10-CM | POA: Diagnosis not present

## 2017-10-12 DIAGNOSIS — R55 Syncope and collapse: Secondary | ICD-10-CM | POA: Diagnosis not present

## 2017-10-12 MED ORDER — CARBIDOPA-LEVODOPA 25-100 MG PO TABS: ORAL_TABLET | ORAL | 5 refills | Status: DC

## 2017-10-12 MED ORDER — QUETIAPINE FUMARATE 25 MG PO TABS: 12.5000 mg | ORAL_TABLET | Freq: Every day | ORAL | 2 refills | Status: DC
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
## 2017-10-12 NOTE — Patient Instructions (Signed)
Quetiapine tablets What is this medicine? QUETIAPINE (kwe TYE a peen) is an antipsychotic. It is used to treat schizophrenia and bipolar disorder, also known as manic-depression. This medicine may be used for other purposes; ask your health care provider or pharmacist if you have questions. COMMON BRAND NAME(S): Seroquel What should I tell my health care provider before I take this medicine? They need to know if you have any of these conditions: -brain tumor or head injury -breast cancer -cataracts -diabetes -difficulty swallowing -heart disease -kidney disease -liver disease -low blood counts, like low white cell, platelet, or red cell counts -low blood pressure or dizziness when standing up -Parkinson's disease -previous heart attack -seizures -suicidal thoughts, plans, or attempt by you or a family member -thyroid disease -an unusual or allergic reaction to quetiapine, other medicines, foods, dyes, or preservatives -pregnant or trying to get pregnant -breast-feeding How should I use this medicine? Take this medicine by mouth. Swallow it with a drink of water. Follow the directions on the prescription label. If it upsets your stomach you can take it with food. Take your medicine at regular intervals. Do not take it more often than directed. Do not stop taking except on the advice of your doctor or health care professional. A special MedGuide will be given to you by the pharmacist with each prescription and refill. Be sure to read this information carefully each time. Talk to your pediatrician regarding the use of this medicine in children. While this drug may be prescribed for children as young as 10 years for selected conditions, precautions do apply. Patients over age 65 years may have a stronger reaction to this medicine and need smaller doses. Overdosage: If you think you have taken too much of this medicine contact a poison control center or emergency room at once. NOTE: This  medicine is only for you. Do not share this medicine with others. What if I miss a dose? If you miss a dose, take it as soon as you can. If it is almost time for your next dose, take only that dose. Do not take double or extra doses. What may interact with this medicine? Do not take this medicine with any of the following medications: -certain medicines for fungal infections like fluconazole, itraconazole, ketoconazole, posaconazole, voriconazole -cisapride -dofetilide -dronedarone -droperidol -grepafloxacin -halofantrine -phenothiazines like chlorpromazine, mesoridazine, thioridazine -pimozide -sparfloxacin -ziprasidone This medicine may also interact with the following medications: -alcohol -antiviral medicines for HIV or AIDS -certain medicines for blood pressure -certain medicines for depression, anxiety, or psychotic disturbances like haloperidol, lorazepam -certain medicines for diabetes -certain medicines for Parkinson's disease -certain medicines for seizures like carbamazepine, phenobarbital, phenytoin -cimetidine -erythromycin -other medicines that prolong the QT interval (cause an abnormal heart rhythm) -rifampin -steroid medicines like prednisone or cortisone This list may not describe all possible interactions. Give your health care provider a list of all the medicines, herbs, non-prescription drugs, or dietary supplements you use. Also tell them if you smoke, drink alcohol, or use illegal drugs. Some items may interact with your medicine. What should I watch for while using this medicine? Visit your doctor or health care professional for regular checks on your progress. It may be several weeks before you see the full effects of this medicine. Your health care provider may suggest that you have your eyes examined prior to starting this medicine, and every 6 months thereafter. If you have been taking this medicine regularly for some time, do not suddenly stop taking it.  You must gradually   reduce the dose or your symptoms may get worse. Ask your doctor or health care professional for advice. Patients and their families should watch out for worsening depression or thoughts of suicide. Also watch out for sudden or severe changes in feelings such as feeling anxious, agitated, panicky, irritable, hostile, aggressive, impulsive, severely restless, overly excited and hyperactive, or not being able to sleep. If this happens, especially at the beginning of antidepressant treatment or after a change in dose, call your health care professional. You may get dizzy or drowsy. Do not drive, use machinery, or do anything that needs mental alertness until you know how this medicine affects you. Do not stand or sit up quickly, especially if you are an older patient. This reduces the risk of dizzy or fainting spells. Alcohol can increase dizziness and drowsiness. Avoid alcoholic drinks. Do not treat yourself for colds, diarrhea or allergies. Ask your doctor or health care professional for advice, some ingredients may increase possible side effects. This medicine can reduce the response of your body to heat or cold. Dress warm in cold weather and stay hydrated in hot weather. If possible, avoid extreme temperatures like saunas, hot tubs, very hot or cold showers, or activities that can cause dehydration such as vigorous exercise. What side effects may I notice from receiving this medicine? Side effects that you should report to your doctor or health care professional as soon as possible: -allergic reactions like skin rash, itching or hives, swelling of the face, lips, or tongue -difficulty swallowing -fast or irregular heartbeat -fever or chills, sore throat -fever with rash, swollen lymph nodes, or swelling of the face -increased hunger or thirst -increased urination -problems with balance, talking, walking -seizures -stiff muscles -suicidal thoughts or other mood  changes -uncontrollable head, mouth, neck, arm, or leg movements -unusually weak or tired Side effects that usually do not require medical attention (report to your doctor or health care professional if they continue or are bothersome): -change in sex drive or performance -constipation -drowsy or dizzy -dry mouth -stomach upset -weight gain This list may not describe all possible side effects. Call your doctor for medical advice about side effects. You may report side effects to FDA at 1-800-FDA-1088. Where should I keep my medicine? Keep out of the reach of children. Store at room temperature between 15 and 30 degrees C (59 and 86 degrees F). Throw away any unused medicine after the expiration date. NOTE: This sheet is a summary. It may not cover all possible information. If you have questions about this medicine, talk to your doctor, pharmacist, or health care provider.  2018 Elsevier/Gold Standard (2015-03-18 13:07:35)  

## 2017-11-10 ENCOUNTER — Telehealth: Payer: Self-pay | Admitting: Neurology

## 2017-11-10 ENCOUNTER — Other Ambulatory Visit: Payer: Self-pay | Admitting: Neurology

## 2017-11-10 DIAGNOSIS — G2581 Restless legs syndrome: Secondary | ICD-10-CM

## 2017-11-10 DIAGNOSIS — R55 Syncope and collapse: Secondary | ICD-10-CM

## 2017-11-10 MED ORDER — CARBIDOPA-LEVODOPA 25-100 MG PO TABS: ORAL_TABLET | ORAL | 5 refills | Status: DC

## 2017-11-10 NOTE — Telephone Encounter (Signed)
I questioned the dosage that Dr Brett Fairy wanted to do for the patient, the patient currently was taken the dose 2 at 2:30, 2 at 4:30. Dr dohmeier has recommended the patient take 2 around 3:30/4 and then 3 after 7

## 2017-11-10 NOTE — Telephone Encounter (Signed)
Restless leg has gotten worse over the past month, she has increased carbidopa-levodopa (SINEMET IR) 25-100 MG tablet to tabs/day. She will need new RX with directions and qty sent to new pharmacy: [facility].  Please delete [facility], it closed

## 2017-11-10 NOTE — Telephone Encounter (Signed)
Called the patient to discuss this further and make and see what she had actually been taking? With this change that Dr Brett Fairy recommended  the patient would still be getting the same amount of pills in the previous phone note I felt like the patient was needing more then was is currently already given. LVM for the patient to call back to discuss further.

## 2017-11-10 NOTE — Telephone Encounter (Signed)
Patient returned call and I was able to discuss the time frame of the medication she was currently taken. I went over how Dr Brett Fairy would perfer her to move forward to see if this time frame would be more helpful to the patient. Pt verbalized understanding. RX sent

## 2017-11-22 ENCOUNTER — Other Ambulatory Visit: Payer: Self-pay | Admitting: Neurology

## 2017-11-22 MED ORDER — ESCITALOPRAM OXALATE 10 MG PO TABS: 10.0000 mg | ORAL_TABLET | Freq: Every day | ORAL | 3 refills | Status: DC

## 2017-11-23 ENCOUNTER — Other Ambulatory Visit: Payer: Self-pay | Admitting: Neurology

## 2017-11-23 MED ORDER — ESCITALOPRAM OXALATE 10 MG PO TABS: 10.0000 mg | ORAL_TABLET | Freq: Every day | ORAL | 3 refills | Status: DC

## 2017-12-08 DIAGNOSIS — Z85828 Personal history of other malignant neoplasm of skin: Secondary | ICD-10-CM | POA: Diagnosis not present

## 2017-12-08 DIAGNOSIS — L812 Freckles: Secondary | ICD-10-CM | POA: Diagnosis not present

## 2017-12-08 DIAGNOSIS — D1801 Hemangioma of skin and subcutaneous tissue: Secondary | ICD-10-CM | POA: Diagnosis not present

## 2017-12-08 DIAGNOSIS — L570 Actinic keratosis: Secondary | ICD-10-CM | POA: Diagnosis not present

## 2017-12-08 DIAGNOSIS — D225 Melanocytic nevi of trunk: Secondary | ICD-10-CM | POA: Diagnosis not present

## 2017-12-08 DIAGNOSIS — L821 Other seborrheic keratosis: Secondary | ICD-10-CM | POA: Diagnosis not present

## 2017-12-08 DIAGNOSIS — L298 Other pruritus: Secondary | ICD-10-CM | POA: Diagnosis not present

## 2017-12-27 DIAGNOSIS — M8589 Other specified disorders of bone density and structure, multiple sites: Secondary | ICD-10-CM | POA: Diagnosis not present

## 2017-12-27 DIAGNOSIS — Z1231 Encounter for screening mammogram for malignant neoplasm of breast: Secondary | ICD-10-CM | POA: Diagnosis not present

## 2018-01-04 ENCOUNTER — Telehealth: Payer: Self-pay

## 2018-01-04 NOTE — Telephone Encounter (Signed)
Pt was given bmd results & recommendations. Pt states her pcp called her with results 10 mins ago & they are going to manage it for her. Pt aware to call if any questions.

## 2018-01-17 ENCOUNTER — Encounter: Payer: Self-pay | Admitting: Certified Nurse Midwife

## 2018-04-13 ENCOUNTER — Ambulatory Visit (INDEPENDENT_AMBULATORY_CARE_PROVIDER_SITE_OTHER): Payer: PPO | Admitting: Adult Health

## 2018-04-13 ENCOUNTER — Encounter: Payer: Self-pay | Admitting: Adult Health

## 2018-04-13 VITALS — BP 121/67 | HR 65 | Ht 68.0 in | Wt 153.5 lb

## 2018-04-13 DIAGNOSIS — G2581 Restless legs syndrome: Secondary | ICD-10-CM | POA: Diagnosis not present

## 2018-04-13 MED ORDER — GABAPENTIN 100 MG PO CAPS
100.0000 mg | ORAL_CAPSULE | Freq: Three times a day (TID) | ORAL | 5 refills | Status: DC
Start: 2018-04-13 — End: 2018-10-04

## 2018-04-13 MED ORDER — QUETIAPINE FUMARATE 25 MG PO TABS: 12.5000 mg | ORAL_TABLET | Freq: Every day | ORAL | 2 refills | Status: DC

## 2018-04-13 NOTE — Patient Instructions (Signed)
Your Plan:  Continue sinemet Start Gabapentin 100 mg three times a day If your symptoms worsen or you develop new symptoms please let us know.   Thank you for coming to see [place] at [facility]. I hope we have been able to provide you high quality care today.  You may receive a patient satisfaction survey over the next few weeks. We would appreciate your feedback and comments so that we may continue to improve ourselves and the health of our patients.

## 2018-04-13 NOTE — Progress Notes (Signed)
PATIENT: Erin Shepherd DOB: 1950/10/02  REASON FOR VISIT: follow up HISTORY FROM: patient  HISTORY OF PRESENT ILLNESS: Today 04/13/18:  Ms. Erin Shepherd is a 67 year old female with a history of restless leg syndrome.  She returns today for follow-up.  She is currently on Seroquel and Sinemet for her restless legs.  She reports that she does not take it regularly.  States that she is afraid that she will accommodate to do it as she was to Ambien.  She states that when she did take Seroquel she did sleep all night.  However she continues to have breakthrough symptoms in the afternoon.  She does take Sinemet which gives her some relief but reports that she is physically not able to sit down in her recliner without having symptoms.  She states that throughout the day she is unable to sit and read a book without her symptoms flaring up.  He states that it is much worse in the afternoon and before bedtime.  She reports that she typically gets up at least one time at night due to her symptoms.  She returns today for evaluation.    HISTORY (copied from Dr. Edwena Felty note): she had no additional syncopal episodes after she started on Lexapro and it seems that an SSRI is able to stabilize these dysautonomic events.   She has had no  decrease in restless leg frequency after d/c of anti histamines- But she developed allergic rhinitis.  RLS is not worse, either - on carbidopa levo dopa. - but she reports insomnia at 8 out of 10 days,  Insomnia often beginning at 2.30 AM.  She gets 4 hours on average. I discussed seroquel. She had failed ambien with her PCP years ago.   REVIEW OF SYSTEMS: Out of a complete 14 system review of symptoms, the patient complains only of the following symptoms, and all other reviewed systems are negative.   See HPI  ALLERGIES: Allergies  Allergen Reactions  . Sulfonamide Derivatives     HOME MEDICATIONS: Outpatient Medications Prior to Visit  Medication Sig Dispense Refill   . aspirin 81 MG tablet Take 81 mg by mouth. 3 times a week    . BIOTIN PO Take by mouth.    . carbidopa-levodopa (SINEMET IR) 25-100 MG tablet TAKE 2 TABLETS BY MOUTH AT 3:30 PM AND 3 TABLETS AT 7PM 150 tablet 5  . cetirizine (ZYRTEC) 10 MG tablet Take 10 mg daily by mouth.    . escitalopram (LEXAPRO) 10 MG tablet Take 1 tablet (10 mg total) by mouth daily. 90 tablet 3  . QUEtiapine (SEROQUEL) 25 MG tablet Take 0.5-1 tablets (12.5-25 mg total) by mouth at bedtime. 30 tablet 2  . VITAMIN D, ERGOCALCIFEROL, PO Take 1 tablet by mouth daily.     No facility-administered medications prior to visit.     PAST MEDICAL HISTORY: Past Medical History:  Diagnosis Date  . Allergic rhinitis   . Arthritis   . Insomnia   . Osteopenia   . Restless leg syndrome   . 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 Squamous cell carcinoma 04/09/13   left collar bone-about one inch from area removed in 2006    PAST SURGICAL HISTORY: Past Surgical History:  Procedure Laterality Date  . ABDOMINAL HYSTERECTOMY  2001  . BUNIONECTOMY    . squamous cell carcinoma removed  9/06   left collar bone  . squamous cell carcinoma removed  7/14   left collar bone  .  tubes in ear     as a child    FAMILY HISTORY: Family History  Problem Relation Age of Onset  . Emphysema Mother   . Asthma Mother   . Heart disease Mother   . Hypertension Mother     SOCIAL HISTORY: Social History   Socioeconomic History  . Marital status: Married    Spouse name: Not on file  . Number of children: Y  . Years of education: Not on file  . Highest education level: Not on file  Occupational History  . Not on file  Social Needs  . Financial resource strain: Not on file  . Food insecurity:    Worry: Not on file    Inability: Not on file  . Transportation needs:    Medical: Not on file    Non-medical: Not on file  Tobacco Use  . Smoking status: Never Smoker  . Smokeless tobacco: Never Used  Substance and  Sexual Activity  . Alcohol use: Yes    Alcohol/week: 3.0 oz    Types: 5 Standard drinks or equivalent per week  . Drug use: No  . Sexual activity: Yes    Partners: Male    Birth control/protection: Surgical    Comment: TAH  Lifestyle  . Physical activity:    Days per week: Not on file    Minutes per session: Not on file  . Stress: Not on file  Relationships  . Social connections:    Talks on phone: Not on file    Gets together: Not on file    Attends religious service: Not on file    Active member of club or organization: Not on file    Attends meetings of clubs or organizations: Not on file    Relationship status: Not on file  . Intimate partner violence:    Fear of current or ex partner: Not on file    Emotionally abused: Not on file    Physically abused: Not on file    Forced sexual activity: Not on file  Other Topics Concern  . Not on file  Social History Narrative  . Not on file      PHYSICAL EXAM  Vitals:   04/13/18 1440  BP: 121/67  Pulse: 65  Weight: 153 lb 8 oz (69.6 kg)  Height: 5\' 8"  (1.727 m)   Body mass index is 23.34 kg/m.  Generalized: Well developed, in no acute distress   Neurological examination  Mentation: Alert oriented to time, place, history taking. Follows all commands speech and language fluent Cranial nerve II-XII: Pupils were equal round reactive to light. Extraocular movements were full, visual field were full on confrontational test. Facial sensation and strength were normal. Uvula tongue midline. Head turning and shoulder shrug  were normal and symmetric. Motor: The motor testing reveals 5 over 5 strength of all 4 extremities. Good symmetric motor tone is noted throughout.  Sensory: Sensory testing is intact to soft touch on all 4 extremities. No evidence of extinction is noted.  Coordination: Cerebellar testing reveals good finger-nose-finger and heel-to-shin bilaterally.  Gait and station: Gait is normal. Tandem gait is normal.  Romberg is negative. No drift is seen.  Reflexes: Deep tendon reflexes are symmetric and normal bilaterally.   DIAGNOSTIC DATA (LABS, IMAGING, TESTING) - I reviewed patient records, labs, notes, testing and imaging myself where available.  Lab Results  Component Value Date   HGB 13.4 07/04/2014      ASSESSMENT AND PLAN 67 y.o. year old female  has a past medical history of Allergic rhinitis, Arthritis, Insomnia, Osteopenia, Restless leg syndrome, Squamous cell carcinoma (9/06), and Squamous cell carcinoma (04/09/13). here with:  1.  Restless leg syndrome  The patient will continue on Sinemet.  She has tried several medications in the past without benefit.  She reports that she has not tried gabapentin.  She will take gabapentin 100 mg 3 times a day.  We will see if this is beneficial for her symptoms.  For now she will hold off on taking Seroquel.  Gabapentin may offer her some benefit with her sleep as well.  Most likely we will need to increase her dose if she is tolerating it well in order for her to get substantial benefit.  I reviewed potential side effects with the patient.  She is advised that if her symptoms worsen or she develops new symptoms she should let us know.  she will follow-up in 6 months or sooner if needed.   I spent 25 minutes with the patient. 50% of this time was spent discussing her medication   Ward Givens, MSN, NP-C 04/13/2018, 1:49 PM [facility] [address], [place], [place] [address] [phone]

## 2018-04-28 NOTE — Progress Notes (Signed)
I agree with the assessment and plan as directed by NP .The patient is known to me .   Heyli Min, MD  

## 2018-05-02 DIAGNOSIS — D0462 Carcinoma in situ of skin of left upper limb, including shoulder: Secondary | ICD-10-CM | POA: Diagnosis not present

## 2018-05-02 DIAGNOSIS — L812 Freckles: Secondary | ICD-10-CM | POA: Diagnosis not present

## 2018-05-02 DIAGNOSIS — L821 Other seborrheic keratosis: Secondary | ICD-10-CM | POA: Diagnosis not present

## 2018-05-02 DIAGNOSIS — L986 Other infiltrative disorders of the skin and subcutaneous tissue: Secondary | ICD-10-CM | POA: Diagnosis not present

## 2018-05-02 DIAGNOSIS — Z85828 Personal history of other malignant neoplasm of skin: Secondary | ICD-10-CM | POA: Diagnosis not present

## 2018-05-02 DIAGNOSIS — L570 Actinic keratosis: Secondary | ICD-10-CM | POA: Diagnosis not present

## 2018-05-02 DIAGNOSIS — L578 Other skin changes due to chronic exposure to nonionizing radiation: Secondary | ICD-10-CM | POA: Diagnosis not present

## 2018-05-02 DIAGNOSIS — L308 Other specified dermatitis: Secondary | ICD-10-CM | POA: Diagnosis not present

## 2018-05-02 DIAGNOSIS — D485 Neoplasm of uncertain behavior of skin: Secondary | ICD-10-CM | POA: Diagnosis not present

## 2018-05-16 ENCOUNTER — Other Ambulatory Visit: Payer: Self-pay | Admitting: Neurology

## 2018-05-16 DIAGNOSIS — G2581 Restless legs syndrome: Secondary | ICD-10-CM

## 2018-05-16 DIAGNOSIS — R55 Syncope and collapse: Secondary | ICD-10-CM

## 2018-05-16 MED ORDER — CARBIDOPA-LEVODOPA 25-100 MG PO TABS: ORAL_TABLET | ORAL | 5 refills | Status: DC

## 2018-07-18 DIAGNOSIS — L820 Inflamed seborrheic keratosis: Secondary | ICD-10-CM | POA: Diagnosis not present

## 2018-07-18 DIAGNOSIS — Z85828 Personal history of other malignant neoplasm of skin: Secondary | ICD-10-CM | POA: Diagnosis not present

## 2018-07-18 DIAGNOSIS — L570 Actinic keratosis: Secondary | ICD-10-CM | POA: Diagnosis not present

## 2018-08-29 ENCOUNTER — Encounter: Payer: Self-pay | Admitting: Certified Nurse Midwife

## 2018-08-29 ENCOUNTER — Other Ambulatory Visit: Payer: Self-pay

## 2018-08-29 ENCOUNTER — Ambulatory Visit (INDEPENDENT_AMBULATORY_CARE_PROVIDER_SITE_OTHER): Payer: PPO | Admitting: Certified Nurse Midwife

## 2018-08-29 VITALS — BP 124/80 | HR 70 | Resp 16 | Ht 67.75 in | Wt 157.0 lb

## 2018-08-29 DIAGNOSIS — E559 Vitamin D deficiency, unspecified: Secondary | ICD-10-CM | POA: Diagnosis not present

## 2018-08-29 DIAGNOSIS — R221 Localized swelling, mass and lump, neck: Secondary | ICD-10-CM | POA: Diagnosis not present

## 2018-08-29 DIAGNOSIS — R599 Enlarged lymph nodes, unspecified: Secondary | ICD-10-CM | POA: Diagnosis not present

## 2018-08-29 DIAGNOSIS — Z01411 Encounter for gynecological examination (general) (routine) with abnormal findings: Secondary | ICD-10-CM

## 2018-08-29 DIAGNOSIS — Z9189 Other specified personal risk factors, not elsewhere classified: Secondary | ICD-10-CM | POA: Diagnosis not present

## 2018-08-29 DIAGNOSIS — Z780 Asymptomatic menopausal state: Secondary | ICD-10-CM | POA: Diagnosis not present

## 2018-08-29 DIAGNOSIS — Z01419 Encounter for gynecological examination (general) (routine) without abnormal findings: Secondary | ICD-10-CM | POA: Diagnosis not present

## 2018-08-29 NOTE — Patient Instructions (Signed)

## 2018-08-29 NOTE — Progress Notes (Signed)
67 y.o. G1P1 Married  Caucasian Fe here for annual exam. Menopausal no HRT. Denies vaginal dryness or bleeding or change in health issues. Continues to have social stress with spouse many health issues with heart and lung challenges. Sees PCP for aex, restless leg. No other health issues today.  Patient's last menstrual period was 09/28/1999.          Sexually active: No.  The current method of family planning is status post hysterectomy.    Exercising: Yes.    dancing, jogging, weight lifting Smoker:  no  Review of Systems  Constitutional: Negative.   HENT: Negative.   Eyes: Negative.   Respiratory: Negative.   Cardiovascular: Negative.   Gastrointestinal: Negative.   Genitourinary: Negative.   Musculoskeletal: Negative.   Skin: Negative.   Neurological: Negative.   Endo/Heme/Allergies: Negative.   Psychiatric/Behavioral: Negative.     Health Maintenance: Pap:  04-24-13 neg History of Abnormal Pap: no MMG:  12-27-17 category b density birads 2:neg Self Breast exams: no Colonoscopy:  2017 f/u 18yrs BMD:   2019 osteopenia TDaP:  2012 Shingles: had done Pneumonia: not done Hep C and HIV: hep c neg 2015 with pcp Labs: desires screening labs   reports that she has never smoked. She has never used smokeless tobacco. She reports that she drinks about 3.0 standard drinks of alcohol per week. She reports that she does not use drugs.  Past Medical History:  Diagnosis Date  . Allergic rhinitis   . Arthritis   . Insomnia   . Osteopenia   . Restless leg syndrome   . 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 Squamous cell carcinoma 04/09/13   left collar bone-about one inch from area removed in 2006    Past Surgical History:  Procedure Laterality Date  . ABDOMINAL HYSTERECTOMY  2001  . BUNIONECTOMY    . squamous cell carcinoma removed  9/06   left collar bone  . squamous cell carcinoma removed  7/14   left collar bone  . tubes in ear     as a child    Current  Outpatient Medications  Medication Sig Dispense Refill  . aspirin 81 MG tablet Take 81 mg by mouth. 3 times a week    . BIOTIN PO Take by mouth.    Marland Kitchen CALCIUM PO Take 1,500 mg by mouth daily.    . carbidopa-levodopa (SINEMET IR) 25-100 MG tablet TAKE 2 TABLETS BY MOUTH AT 3:30 PM AND 3 TABLETS AT 7PM (Patient taking differently: Take 1 tablet at 1pm, take 1 at 3:30 pm, 2 at 6:30pm & 1 at 9pm) 150 tablet 5  . escitalopram (LEXAPRO) 10 MG tablet Take 1 tablet (10 mg total) by mouth daily. 90 tablet 3  . fexofenadine (ALLEGRA) 180 MG tablet Take 180 mg by mouth every other day.     . gabapentin (NEURONTIN) 100 MG capsule Take 1 capsule (100 mg total) by mouth 3 (three) times daily. 90 capsule 5  . IRON PO Take by mouth daily.    Marland Kitchen VITAMIN D, ERGOCALCIFEROL, PO Take 1 tablet by mouth daily.     No current facility-administered medications for this visit.     Family History  Problem Relation Age of Onset  . Emphysema Mother   . Asthma Mother   . Heart disease Mother   . Hypertension Mother     ROS:  Pertinent items are noted in HPI.  Otherwise, a comprehensive ROS was negative.  Exam:  BP 124/80   Pulse 70   Resp 16   Ht 5' 7.75" (1.721 m)   Wt 157 lb (71.2 kg)   LMP 09/28/1999   BMI 24.05 kg/m  Height: 5' 7.75" (172.1 cm) Ht Readings from Last 3 Encounters:  08/29/18 5' 7.75" (1.721 m)  04/13/18 5\' 8"  (1.727 m)  10/12/17 5\' 8"  (1.727 m)    General appearance: alert, cooperative and appears stated age Head: Normocephalic, without obvious abnormality, atraumatic Neck: no adenopathy, supple, symmetrical, trachea midline and thyroid normal to inspection and palpation Lungs: clear to auscultation bilaterally Breasts: normal appearance, no masses or tenderness, No nipple retraction or dimpling, No nipple discharge or bleeding, No axillary or supraclavicular adenopathy Heart: regular rate and rhythm Abdomen: soft, non-tender; no masses,  no organomegaly Extremities: extremities  normal, atraumatic, no cyanosis or edema Skin: Skin color, texture, turgor normal. No rashes or lesions Lymph nodes: Cervical lymph node on right firm, non tender, all other lymph nodes  normal supraclavicular, and axillary nodes normal. No abnormal inguinal nodes palpated Neurologic: Grossly normal   Pelvic: External genitalia:  no lesions              Urethra:  normal appearing urethra with no masses, tenderness or lesions              Bartholin's and Skene's: normal                 Vagina: normal appearing vagina with normal color and discharge, no lesions              Cervix: absent              Pap taken: No. Bimanual Exam:  Uterus:  uterus absent              Adnexa: no mass, fullness, tenderness               Rectovaginal: Confirms               Anus:  normal sphincter tone, no lesions  Chaperone present: yes  A:  Well Woman with normal exam  Menopausal no HRT S/P TAH for bleeding  Enlarged firm cervical lymph node on right  Osteopenia working on calcium in diet. Will do Vitamin D check today.  Restless leg syndrome with MD management  Social stress with spouse medical issues  Screening labs  P:   Reviewed health and wellness pertinent to exam  Discussed lymph node finding and need to evaluate. Patient palpated area also. Patient will be called with appointment for [place] of neck on right. Order placed questions addressed regarding need to evaluate, could be change due to infection or response to other health issue that maybe present.  Continue to work on exercise, calcium and Vitamin D in diet. Repeat BMD 2 years  Continue follow up with MD as  Indicated  Labs: CBC with diff., Lipid panel, CMP, Vitamin D, TSH  Pap smear: no   counseled on breast self exam, mammography screening, feminine hygiene, adequate intake of calcium and vitamin D, diet and exercise  return annually or prn  An After Visit Summary was printed and given to the patient.

## 2018-08-30 ENCOUNTER — Telehealth: Payer: Self-pay | Admitting: *Deleted

## 2018-08-30 ENCOUNTER — Other Ambulatory Visit: Payer: Self-pay | Admitting: Certified Nurse Midwife

## 2018-08-30 DIAGNOSIS — R899 Unspecified abnormal finding in specimens from other organs, systems and tissues: Secondary | ICD-10-CM

## 2018-08-30 DIAGNOSIS — R599 Enlarged lymph nodes, unspecified: Secondary | ICD-10-CM

## 2018-08-30 LAB — COMPREHENSIVE METABOLIC PANEL
A/G RATIO: 2.6 — AB (ref 1.2–2.2)
ALBUMIN: 4.4 g/dL (ref 3.6–4.8)
ALK PHOS: 114 IU/L (ref 39–117)
ALT: 44 IU/L — ABNORMAL HIGH (ref 0–32)
AST: 32 IU/L (ref 0–40)
BUN / CREAT RATIO: 21 (ref 12–28)
BUN: 14 mg/dL (ref 8–27)
Bilirubin Total: 0.4 mg/dL (ref 0.0–1.2)
CALCIUM: 9.3 mg/dL (ref 8.7–10.3)
CHLORIDE: 100 mmol/L (ref 96–106)
CO2: 23 mmol/L (ref 20–29)
Creatinine, Ser: 0.68 mg/dL (ref 0.57–1.00)
GFR calc non Af Amer: 91 mL/min/{1.73_m2} (ref >59)
GFR, EST AFRICAN AMERICAN: 105 mL/min/{1.73_m2} (ref >59)
GLOBULIN, TOTAL: 1.7 g/dL (ref 1.5–4.5)
Glucose: 92 mg/dL (ref 65–99)
Potassium: 3.9 mmol/L (ref 3.5–5.2)
SODIUM: 139 mmol/L (ref 134–144)
Total Protein: 6.1 g/dL (ref 6.0–8.5)

## 2018-08-30 LAB — CBC WITH DIFFERENTIAL/PLATELET
BASOS ABS: 0.1 10*3/uL (ref 0.0–0.2)
Basos: 1 %
EOS (ABSOLUTE): 0.1 10*3/uL (ref 0.0–0.4)
EOS: 2 %
HEMATOCRIT: 39.5 % (ref 34.0–46.6)
Hemoglobin: 13.2 g/dL (ref 11.1–15.9)
Immature Grans (Abs): 0 10*3/uL (ref 0.0–0.1)
Immature Granulocytes: 0 %
LYMPHS ABS: 1 10*3/uL (ref 0.7–3.1)
Lymphs: 20 %
MCH: 30.2 pg (ref 26.6–33.0)
MCHC: 33.4 g/dL (ref 31.5–35.7)
MCV: 90 fL (ref 79–97)
MONOS ABS: 0.7 10*3/uL (ref 0.1–0.9)
Monocytes: 13 %
Neutrophils Absolute: 3.4 10*3/uL (ref 1.4–7.0)
Neutrophils: 64 %
Platelets: 207 10*3/uL (ref 150–450)
RBC: 4.37 x10E6/uL (ref 3.77–5.28)
RDW: 11.7 % — AB (ref 12.3–15.4)
WBC: 5.3 10*3/uL (ref 3.4–10.8)

## 2018-08-30 LAB — LIPID PANEL
CHOLESTEROL TOTAL: 223 mg/dL — AB (ref 100–199)
Chol/HDL Ratio: 3 ratio (ref 0.0–4.4)
HDL: 75 mg/dL (ref >39)
LDL Calculated: 129 mg/dL — ABNORMAL HIGH (ref 0–99)
Triglycerides: 94 mg/dL (ref 0–149)
VLDL Cholesterol Cal: 19 mg/dL (ref 5–40)

## 2018-08-30 LAB — TSH: TSH: 1.32 u[IU]/mL (ref 0.450–4.500)

## 2018-08-30 LAB — VITAMIN D 25 HYDROXY (VIT D DEFICIENCY, FRACTURES): Vit D, 25-Hydroxy: 72.8 ng/mL (ref 30.0–100.0)

## 2018-08-30 NOTE — Telephone Encounter (Signed)
-----   Message from Regina Eck, CNM sent at 08/29/2018  9:11 PM EST ----- Please schedule patient for [place] of right neck area for cervical lymph node firmness on right side of neck midline . Patient aware she will be called with appointment information.

## 2018-08-30 NOTE — Telephone Encounter (Signed)
Order placed for US soft tissue head and neck at [facility].   Call placed to patient, left detailed message, ok per dpr. Advised patient order placed for [place] of neck at [facility], they will be contacting you directly to schedule, you may also contact them at [phone] to schedule. Please return call to office at [phone] of any additional questions.   Routing to provider for final review.  Will close encounter.

## 2018-09-04 ENCOUNTER — Ambulatory Visit
Admission: RE | Admit: 2018-09-04 | Discharge: 2018-09-04 | Disposition: A | Discharge status: Home / Self Care | Payer: PPO | Source: Ambulatory Visit | Attending: Certified Nurse Midwife | Admitting: Certified Nurse Midwife

## 2018-09-04 DIAGNOSIS — R599 Enlarged lymph nodes, unspecified: Secondary | ICD-10-CM

## 2018-09-05 ENCOUNTER — Telehealth: Payer: Self-pay | Admitting: *Deleted

## 2018-09-05 DIAGNOSIS — R899 Unspecified abnormal finding in specimens from other organs, systems and tissues: Secondary | ICD-10-CM

## 2018-09-05 NOTE — Telephone Encounter (Signed)
Spoke with patient, advised as seen below per Melvia Heaps, CNM. Patient previously scheduled for lab appt on 09/25/18 at 11:30am. Patient verbalizes understanding and is agreeable.   Melvia Heaps, CNM -patient is returning for repeat ALT, to confirm, you would like to add CBC with Diff?

## 2018-09-05 NOTE — Telephone Encounter (Signed)
Yes would like CBC with Diff again

## 2018-09-05 NOTE — Telephone Encounter (Signed)
-----   Message from Regina Eck, CNM sent at 09/04/2018  8:33 PM EST ----- Notify patient her US showed  Multiple small lymph nodes in right neck, which are benign appearing in the area of concern. Her CBC with diff was essentially normal, no further concern at this time. Would like to recheck in one month.

## 2018-09-05 NOTE — Telephone Encounter (Signed)
Future lab order placed for CBC with diff.   Encounter closed.

## 2018-09-05 NOTE — Telephone Encounter (Signed)
Notes recorded by Burnice Logan, RN on 09/05/2018 at 10:38 AM EST Left message to call Sharee Pimple, RN at [facility].

## 2018-09-08 ENCOUNTER — Other Ambulatory Visit: Payer: Self-pay | Admitting: Family Medicine

## 2018-09-08 ENCOUNTER — Ambulatory Visit
Admission: RE | Admit: 2018-09-08 | Discharge: 2018-09-08 | Disposition: A | Discharge status: Home / Self Care | Payer: PPO | Source: Ambulatory Visit | Attending: Family Medicine | Admitting: Family Medicine

## 2018-09-08 DIAGNOSIS — J189 Pneumonia, unspecified organism: Secondary | ICD-10-CM

## 2018-09-25 ENCOUNTER — Other Ambulatory Visit: Payer: PPO

## 2018-10-03 ENCOUNTER — Other Ambulatory Visit: Payer: Self-pay | Admitting: Emergency Medicine

## 2018-10-03 ENCOUNTER — Other Ambulatory Visit (INDEPENDENT_AMBULATORY_CARE_PROVIDER_SITE_OTHER): Payer: PPO

## 2018-10-03 DIAGNOSIS — R899 Unspecified abnormal finding in specimens from other organs, systems and tissues: Secondary | ICD-10-CM

## 2018-10-04 ENCOUNTER — Other Ambulatory Visit: Payer: Self-pay | Admitting: Adult Health

## 2018-10-04 ENCOUNTER — Encounter: Payer: Self-pay | Admitting: Adult Health

## 2018-10-04 ENCOUNTER — Telehealth: Payer: Self-pay | Admitting: *Deleted

## 2018-10-04 LAB — CBC WITH DIFFERENTIAL/PLATELET
Basophils Absolute: 0 10*3/uL (ref 0.0–0.2)
Basos: 1 %
EOS (ABSOLUTE): 0.1 10*3/uL (ref 0.0–0.4)
Eos: 2 %
Hematocrit: 39.2 % (ref 34.0–46.6)
Hemoglobin: 13.4 g/dL (ref 11.1–15.9)
Immature Grans (Abs): 0 10*3/uL (ref 0.0–0.1)
Immature Granulocytes: 0 %
Lymphocytes Absolute: 1.2 10*3/uL (ref 0.7–3.1)
Lymphs: 22 %
MCH: 30.9 pg (ref 26.6–33.0)
MCHC: 34.2 g/dL (ref 31.5–35.7)
MCV: 90 fL (ref 79–97)
Monocytes Absolute: 0.4 10*3/uL (ref 0.1–0.9)
Monocytes: 8 %
Neutrophils Absolute: 3.5 10*3/uL (ref 1.4–7.0)
Neutrophils: 67 %
Platelets: 228 10*3/uL (ref 150–450)
RBC: 4.34 x10E6/uL (ref 3.77–5.28)
RDW: 11.9 % (ref 11.7–15.4)
WBC: 5.2 10*3/uL (ref 3.4–10.8)

## 2018-10-04 LAB — ALT: ALT: 41 IU/L — ABNORMAL HIGH (ref 0–32)

## 2018-10-04 NOTE — Telephone Encounter (Signed)
Notes recorded by Burnice Logan, RN on 10/04/2018 at 11:29 AM EST Left message to call Sharee Pimple, RN at [facility].   ------  Notes recorded by Regina Eck, CNM on 10/04/2018 at 10:37 AM EST Notify patient her CBC with diff is normal. Her ALT which is liver function is still elevated, has decreased from 44 to 41. Per chart she has PCP and feel she should follow up with PCP regarding this now. If she is not seeing PCP we can help her establish. Will need copy of labs

## 2018-10-04 NOTE — Telephone Encounter (Signed)
Patient returned call. All results reviewed with patient and she verbalized understanding. RN offered to call and schedule appointment with PCP, Dr. Dema Severin, but patient states she will call and schedule. Will fax a copy of labs to Dr. Dema Severin. Patient agreeable.   Routing to provider and will close encounter.

## 2018-10-09 ENCOUNTER — Ambulatory Visit (INDEPENDENT_AMBULATORY_CARE_PROVIDER_SITE_OTHER): Payer: PPO | Admitting: Adult Health

## 2018-10-09 ENCOUNTER — Encounter: Payer: Self-pay | Admitting: Adult Health

## 2018-10-09 VITALS — BP 130/75 | HR 66 | Wt 156.8 lb

## 2018-10-09 DIAGNOSIS — G2581 Restless legs syndrome: Secondary | ICD-10-CM

## 2018-10-09 MED ORDER — CARBIDOPA-LEVODOPA 25-100 MG PO TABS: ORAL_TABLET | ORAL | 5 refills | Status: DC

## 2018-10-09 MED ORDER — GABAPENTIN 100 MG PO CAPS: 200.0000 mg | ORAL_CAPSULE | Freq: Three times a day (TID) | ORAL | 3 refills | Status: DC

## 2018-10-09 NOTE — Progress Notes (Signed)
PATIENT: Erin Shepherd DOB: 1950/12/04  REASON FOR VISIT: follow up HISTORY FROM: patient  HISTORY OF PRESENT ILLNESS: Today 10/09/18:  Erin Shepherd is a 68 year old female with a history of restless leg syndrome.  She returns today for follow-up.  At the last visit she was started on gabapentin 100 mg 3 times a day and she continues on Sinemet.  She reports that gabapentin has been beneficial for sleep.  She states that if she does wake up during the night she is able to go back to sleep pretty quickly.  She is unsure how much it has helped with the restless legs.  She states that her symptoms typically can occur anywhere from 2PM and 5 PM.  She states that if she sits down to read a book or she is in a car riding she may have symptoms.  She states however if she goes to get in the bed her restless legs subside.  She in the past she has tried Requip and Mirapex.  She is currently taking gabapentin at 1 PM, 5 PM and at 830.  She returns today for follow-up.  HISTORY 04/13/18:  Erin Shepherd is a 68 year old female with a history of restless leg syndrome.  She returns today for follow-up.  She is currently on Seroquel and Sinemet for her restless legs.  She reports that she does not take it regularly.  States that she is afraid that she will accommodate to do it as she was to Ambien.  She states that when she did take Seroquel she did sleep all night.  However she continues to have breakthrough symptoms in the afternoon.  She does take Sinemet which gives her some relief but reports that she is physically not able to sit down in her recliner without having symptoms.  She states that throughout the day she is unable to sit and read a book without her symptoms flaring up.  He states that it is much worse in the afternoon and before bedtime.  She reports that she typically gets up at least one time at night due to her symptoms.  She returns today for evaluation.  REVIEW OF SYSTEMS: Out of a complete 14  system review of symptoms, the patient complains only of the following symptoms, and all other reviewed systems are negative.  Restless leg  ALLERGIES: Allergies  Allergen Reactions  . Sulfonamide Derivatives     HOME MEDICATIONS: Outpatient Medications Prior to Visit  Medication Sig Dispense Refill  . aspirin 81 MG tablet Take 81 mg by mouth. 3 times a week    . BIOTIN PO Take by mouth.    Marland Kitchen CALCIUM PO Take 1,500 mg by mouth daily.    . carbidopa-levodopa (SINEMET IR) 25-100 MG tablet TAKE 2 TABLETS BY MOUTH AT 3:30 PM AND 3 TABLETS AT 7PM (Patient taking differently: Take 1 tablet at 1pm, take 1 at 3:30 pm, 2 at 6:30pm & 1 at 9pm) 150 tablet 5  . escitalopram (LEXAPRO) 10 MG tablet Take 1 tablet (10 mg total) by mouth daily. 90 tablet 3  . fexofenadine (ALLEGRA) 180 MG tablet Take 180 mg by mouth every other day.     . gabapentin (NEURONTIN) 100 MG capsule TAKE 1 CAPSULE BY MOUTH 3 TIMES DAILY 90 capsule 5  . Magnesium 100 MG CAPS Take by mouth.    Marland Kitchen VITAMIN D, ERGOCALCIFEROL, PO Take 1 tablet by mouth daily.    . IRON PO Take by mouth daily.  No facility-administered medications prior to visit.     PAST MEDICAL HISTORY: Past Medical History:  Diagnosis Date  . Allergic rhinitis   . Arthritis   . Insomnia   . Osteopenia   . Restless leg syndrome   . 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 Squamous cell carcinoma 04/09/13   left collar bone-about one inch from area removed in 2006    PAST SURGICAL HISTORY: Past Surgical History:  Procedure Laterality Date  . ABDOMINAL HYSTERECTOMY  2001  . BUNIONECTOMY    . squamous cell carcinoma removed  9/06   left collar bone  . squamous cell carcinoma removed  7/14   left collar bone  . tubes in ear     as a child    FAMILY HISTORY: Family History  Problem Relation Age of Onset  . Emphysema Mother   . Asthma Mother   . Heart disease Mother   . Hypertension Mother     SOCIAL HISTORY: Social History    Socioeconomic History  . Marital status: Married    Spouse name: Not on file  . Number of children: Y  . Years of education: Not on file  . Highest education level: Not on file  Occupational History  . Not on file  Social Needs  . Financial resource strain: Not on file  . Food insecurity:    Worry: Not on file    Inability: Not on file  . Transportation needs:    Medical: Not on file    Non-medical: Not on file  Tobacco Use  . Smoking status: Never Smoker  . Smokeless tobacco: Never Used  Substance and Sexual Activity  . Alcohol use: Yes    Alcohol/week: 3.0 standard drinks    Types: 3 Standard drinks or equivalent per week  . Drug use: No  . Sexual activity: Not Currently    Partners: Male    Birth control/protection: Surgical    Comment: TAH  Lifestyle  . Physical activity:    Days per week: Not on file    Minutes per session: Not on file  . Stress: Not on file  Relationships  . Social connections:    Talks on phone: Not on file    Gets together: Not on file    Attends religious service: Not on file    Active member of club or organization: Not on file    Attends meetings of clubs or organizations: Not on file    Relationship status: Not on file  . Intimate partner violence:    Fear of current or ex partner: Not on file    Emotionally abused: Not on file    Physically abused: Not on file    Forced sexual activity: Not on file  Other Topics Concern  . Not on file  Social History Narrative  . Not on file      PHYSICAL EXAM  Vitals:   10/09/18 1311  BP: 130/75  Pulse: 66  Weight: 156 lb 12.8 oz (71.1 kg)   Body mass index is 24.02 kg/m.  Generalized: Well developed, in no acute distress   Neurological examination  Mentation: Alert oriented to time, place, history taking. Follows all commands speech and language fluent Cranial nerve II-XII: Pupils were equal round reactive to light. Extraocular movements were full, visual field were full on  confrontational test. Facial sensation and strength were normal. Uvula tongue midline. Head turning and shoulder shrug  were normal and symmetric. Motor: The motor  testing reveals 5 over 5 strength of all 4 extremities. Good symmetric motor tone is noted throughout.  Sensory: Sensory testing is intact to soft touch on all 4 extremities. No evidence of extinction is noted.  Coordination: Cerebellar testing reveals good finger-nose-finger and heel-to-shin bilaterally.  Gait and station: Gait is normal. Tandem gait is normal. Romberg is negative. No drift is seen.  Reflexes: Deep tendon reflexes are symmetric and normal bilaterally.   DIAGNOSTIC DATA (LABS, IMAGING, TESTING) - I reviewed patient records, labs, notes, testing and imaging myself where available.  Lab Results  Component Value Date   WBC 5.2 10/03/2018   HGB 13.4 10/03/2018   HCT 39.2 10/03/2018   MCV 90 10/03/2018   PLT 228 10/03/2018      Component Value Date/Time   NA 139 08/29/2018 1130   K 3.9 08/29/2018 1130   CL 100 08/29/2018 1130   CO2 23 08/29/2018 1130   GLUCOSE 92 08/29/2018 1130   BUN 14 08/29/2018 1130   CREATININE 0.68 08/29/2018 1130   CALCIUM 9.3 08/29/2018 1130   PROT 6.1 08/29/2018 1130   ALBUMIN 4.4 08/29/2018 1130   AST 32 08/29/2018 1130   ALT 41 (H) 10/03/2018 1339   ALKPHOS 114 08/29/2018 1130   BILITOT 0.4 08/29/2018 1130   GFRNONAA 91 08/29/2018 1130   GFRAA 105 08/29/2018 1130   Lab Results  Component Value Date   CHOL 223 (H) 08/29/2018   HDL 75 08/29/2018   LDLCALC 129 (H) 08/29/2018   TRIG 94 08/29/2018   CHOLHDL 3.0 08/29/2018   No results found for: HGBA1C No results found for: VITAMINB12 Lab Results  Component Value Date   TSH 1.320 08/29/2018      ASSESSMENT AND PLAN 68 y.o. year old female  has a past medical history of Allergic rhinitis, Arthritis, Insomnia, Osteopenia, Restless leg syndrome, Squamous cell carcinoma (9/06), and Squamous cell carcinoma (04/09/13). here  with:  1.  Restless leg syndrome  The patient's sleep has improved on gabapentin however she still continues to have restless leg symptoms typically in the afternoons.  I will increase gabapentin to 200 mg 3 times a day.  She will continue on Sinemet.  If this is not beneficial she will let us know.  We may potentially try Neupro patch in the future.  Patient voiced understanding.  She will follow-up in 6 months or sooner if needed.   I spent 15 minutes with the patient. 50% of this time was spent discussing medication changes   Ward Givens, MSN, NP-C 10/09/2018, 1:23 PM [facility] [address] [place], [place] [address] [phone]

## 2018-10-09 NOTE — Patient Instructions (Signed)
Your Plan:  Continue Sinemet Increase Gabapentin 200 mg three times a day. Not to exceed to 600 mg daily If your symptoms worsen or you develop new symptoms please let us know.   Thank you for coming to see [place] at [facility]. I hope we have been able to provide you high quality care today.  You may receive a patient satisfaction survey over the next few weeks. We would appreciate your feedback and comments so that we may continue to improve ourselves and the health of our patients.

## 2018-10-26 DIAGNOSIS — B078 Other viral warts: Secondary | ICD-10-CM | POA: Diagnosis not present

## 2018-10-26 DIAGNOSIS — D485 Neoplasm of uncertain behavior of skin: Secondary | ICD-10-CM | POA: Diagnosis not present

## 2018-10-26 DIAGNOSIS — Z85828 Personal history of other malignant neoplasm of skin: Secondary | ICD-10-CM | POA: Diagnosis not present

## 2018-11-09 ENCOUNTER — Ambulatory Visit: Payer: PPO | Admitting: Adult Health

## 2018-11-29 DIAGNOSIS — R740 Nonspecific elevation of levels of transaminase and lactic acid dehydrogenase [LDH]: Secondary | ICD-10-CM | POA: Diagnosis not present

## 2018-11-29 DIAGNOSIS — J069 Acute upper respiratory infection, unspecified: Secondary | ICD-10-CM | POA: Diagnosis not present

## 2018-12-11 DIAGNOSIS — L570 Actinic keratosis: Secondary | ICD-10-CM | POA: Diagnosis not present

## 2018-12-11 DIAGNOSIS — D1801 Hemangioma of skin and subcutaneous tissue: Secondary | ICD-10-CM | POA: Diagnosis not present

## 2018-12-11 DIAGNOSIS — Z85828 Personal history of other malignant neoplasm of skin: Secondary | ICD-10-CM | POA: Diagnosis not present

## 2018-12-11 DIAGNOSIS — L812 Freckles: Secondary | ICD-10-CM | POA: Diagnosis not present

## 2018-12-11 DIAGNOSIS — L821 Other seborrheic keratosis: Secondary | ICD-10-CM | POA: Diagnosis not present

## 2019-01-02 ENCOUNTER — Other Ambulatory Visit: Payer: Self-pay | Admitting: Neurology

## 2019-01-02 MED ORDER — ESCITALOPRAM OXALATE 10 MG PO TABS: 10.0000 mg | ORAL_TABLET | Freq: Every day | ORAL | 3 refills | Status: DC

## 2019-02-05 DIAGNOSIS — R740 Nonspecific elevation of levels of transaminase and lactic acid dehydrogenase [LDH]: Secondary | ICD-10-CM | POA: Diagnosis not present

## 2019-02-16 DIAGNOSIS — Z1231 Encounter for screening mammogram for malignant neoplasm of breast: Secondary | ICD-10-CM | POA: Diagnosis not present

## 2019-02-26 DIAGNOSIS — Z20828 Contact with and (suspected) exposure to other viral communicable diseases: Secondary | ICD-10-CM | POA: Diagnosis not present

## 2019-02-26 HISTORY — PX: OTHER SURGICAL HISTORY: SHX169

## 2019-03-06 ENCOUNTER — Encounter: Payer: Self-pay | Admitting: Obstetrics & Gynecology

## 2019-03-12 DIAGNOSIS — D485 Neoplasm of uncertain behavior of skin: Secondary | ICD-10-CM | POA: Diagnosis not present

## 2019-03-12 DIAGNOSIS — D0461 Carcinoma in situ of skin of right upper limb, including shoulder: Secondary | ICD-10-CM | POA: Diagnosis not present

## 2019-03-12 DIAGNOSIS — Z85828 Personal history of other malignant neoplasm of skin: Secondary | ICD-10-CM | POA: Diagnosis not present

## 2019-05-02 ENCOUNTER — Other Ambulatory Visit: Payer: Self-pay

## 2019-05-02 ENCOUNTER — Encounter: Payer: Self-pay | Admitting: Adult Health

## 2019-05-02 ENCOUNTER — Ambulatory Visit (INDEPENDENT_AMBULATORY_CARE_PROVIDER_SITE_OTHER): Payer: PPO | Admitting: Adult Health

## 2019-05-02 DIAGNOSIS — G2581 Restless legs syndrome: Secondary | ICD-10-CM | POA: Diagnosis not present

## 2019-05-02 MED ORDER — GABAPENTIN 100 MG PO CAPS: 200.0000 mg | ORAL_CAPSULE | Freq: Two times a day (BID) | ORAL | 3 refills | Status: DC

## 2019-05-02 MED ORDER — CARBIDOPA-LEVODOPA 25-100 MG PO TABS: ORAL_TABLET | ORAL | 5 refills | Status: DC

## 2019-05-02 NOTE — Progress Notes (Signed)
PATIENT: Erin Shepherd DOB: May 21, 1951  REASON FOR VISIT: follow up HISTORY FROM: patient  HISTORY OF PRESENT ILLNESS: Today 05/02/19:  Erin Shepherd is a 68 year old female with a history of restless leg syndrome.  She returns today for follow-up.  At the last visit gabapentin was increased to 200 mg 3 times a day.  She reports that she has been taking gabapentin 1 tablet at 515 2 tablets before bedtime.  She states that if she sits in her recliner her symptoms are typically controlled.  But if she tries to recline with her feet up within 20 minutes she begins to have symptoms.  She has not tried increasing her afternoon dose of gabapentin.  She continues to take Sinemet 3-4 times a day.  She returns today for an evaluation.  HISTORY 10/09/18:  Erin Shepherd is a 68 year old female with a history of restless leg syndrome.  She returns today for follow-up.  At the last visit she was started on gabapentin 100 mg 3 times a day and she continues on Sinemet.  She reports that gabapentin has been beneficial for sleep.  She states that if she does wake up during the night she is able to go back to sleep pretty quickly.  She is unsure how much it has helped with the restless legs.  She states that her symptoms typically can occur anywhere from 2PM and 5 PM.  She states that if she sits down to read a book or she is in a car riding she may have symptoms.  She states however if she goes to get in the bed her restless legs subside.  She in the past she has tried Requip and Mirapex.  She is currently taking gabapentin at 1 PM, 5 PM and at 830.  She returns today for follow-up.   REVIEW OF SYSTEMS: Out of a complete 14 system review of symptoms, the patient complains only of the following symptoms, and all other reviewed systems are negative.  See HPI  ALLERGIES: Allergies  Allergen Reactions  . Sulfonamide Derivatives     HOME MEDICATIONS: Outpatient Medications Prior to Visit  Medication Sig  Dispense Refill  . aspirin 81 MG tablet Take 81 mg by mouth. 3 times a week    . BIOTIN PO Take by mouth.    Marland Kitchen CALCIUM PO Take 1,500 mg by mouth daily.    . carbidopa-levodopa (SINEMET IR) 25-100 MG tablet Take 1 tablet at 1pm, take 1 at 3:30 pm, 2 at 6:30pm & 1 at 9pm (Patient taking differently: Take 2 @ 3:30 pm and 6:30 pm) 150 tablet 5  . escitalopram (LEXAPRO) 10 MG tablet Take 1 tablet (10 mg total) by mouth daily. 90 tablet 3  . fexofenadine (ALLEGRA) 180 MG tablet Take 180 mg by mouth every other day.     . gabapentin (NEURONTIN) 100 MG capsule Take 2 capsules (200 mg total) by mouth 3 (three) times daily. 540 capsule 3  . Magnesium 100 MG CAPS Take by mouth.    Marland Kitchen VITAMIN D, ERGOCALCIFEROL, PO Take 1 tablet by mouth daily.    . IRON PO Take by mouth daily.     No facility-administered medications prior to visit.     PAST MEDICAL HISTORY: Past Medical History:  Diagnosis Date  . Allergic rhinitis   . Arthritis   . Insomnia   . Osteopenia   . Restless leg syndrome   . 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 Squamous  cell carcinoma 04/09/13   left collar bone-about one inch from area removed in 2006    PAST SURGICAL HISTORY: Past Surgical History:  Procedure Laterality Date  . ABDOMINAL HYSTERECTOMY  2001  . BUNIONECTOMY    . squamous cell carcinoma removed  9/06   left collar bone  . squamous cell carcinoma removed  7/14   left collar bone  . tubes in ear     as a child    FAMILY HISTORY: Family History  Problem Relation Age of Onset  . Emphysema Mother   . Asthma Mother   . Heart disease Mother   . Hypertension Mother     SOCIAL HISTORY: Social History   Socioeconomic History  . Marital status: Married    Spouse name: Not on file  . Number of children: Y  . Years of education: Not on file  . Highest education level: Not on file  Occupational History  . Not on file  Social Needs  . Financial resource strain: Not on file  . Food  insecurity    Worry: Not on file    Inability: Not on file  . Transportation needs    Medical: Not on file    Non-medical: Not on file  Tobacco Use  . Smoking status: Never Smoker  . Smokeless tobacco: Never Used  Substance and Sexual Activity  . Alcohol use: Yes    Alcohol/week: 3.0 standard drinks    Types: 3 Standard drinks or equivalent per week  . Drug use: No  . Sexual activity: Not Currently    Partners: Male    Birth control/protection: Surgical    Comment: TAH  Lifestyle  . Physical activity    Days per week: Not on file    Minutes per session: Not on file  . Stress: Not on file  Relationships  . Social Herbalist on phone: Not on file    Gets together: Not on file    Attends religious service: Not on file    Active member of club or organization: Not on file    Attends meetings of clubs or organizations: Not on file    Relationship status: Not on file  . Intimate partner violence    Fear of current or ex partner: Not on file    Emotionally abused: Not on file    Physically abused: Not on file    Forced sexual activity: Not on file  Other Topics Concern  . Not on file  Social History Narrative  . Not on file      PHYSICAL EXAM  Vitals:   05/02/19 1322  Temp: (!) 96.2 F (35.7 C)  TempSrc: Oral  Weight: 163 lb (73.9 kg)  Height: 5' 7.75" (1.721 m)   Body mass index is 24.97 kg/m.  Generalized: Well developed, in no acute distress   Neurological examination  Mentation: Alert oriented to time, place, history taking. Follows all commands speech and language fluent Cranial nerve II-XII: Pupils were equal round reactive to light. Extraocular movements were full, visual field were full on confrontational test.  Head turning and shoulder shrug  were normal and symmetric. Motor: The motor testing reveals 5 over 5 strength of all 4 extremities. Good symmetric motor tone is noted throughout.  Sensory: Sensory testing is intact to soft touch on  all 4 extremities. No evidence of extinction is noted.  Coordination: Cerebellar testing reveals good finger-nose-finger and heel-to-shin bilaterally.  Gait and station: Gait is normal.  Reflexes: Deep  tendon reflexes are symmetric and normal bilaterally.   DIAGNOSTIC DATA (LABS, IMAGING, TESTING) - I reviewed patient records, labs, notes, testing and imaging myself where available.  Lab Results  Component Value Date   WBC 5.2 10/03/2018   HGB 13.4 10/03/2018   HCT 39.2 10/03/2018   MCV 90 10/03/2018   PLT 228 10/03/2018      Component Value Date/Time   NA 139 08/29/2018 1130   K 3.9 08/29/2018 1130   CL 100 08/29/2018 1130   CO2 23 08/29/2018 1130   GLUCOSE 92 08/29/2018 1130   BUN 14 08/29/2018 1130   CREATININE 0.68 08/29/2018 1130   CALCIUM 9.3 08/29/2018 1130   PROT 6.1 08/29/2018 1130   ALBUMIN 4.4 08/29/2018 1130   AST 32 08/29/2018 1130   ALT 41 (H) 10/03/2018 1339   ALKPHOS 114 08/29/2018 1130   BILITOT 0.4 08/29/2018 1130   GFRNONAA 91 08/29/2018 1130   GFRAA 105 08/29/2018 1130   Lab Results  Component Value Date   CHOL 223 (H) 08/29/2018   HDL 75 08/29/2018   LDLCALC 129 (H) 08/29/2018   TRIG 94 08/29/2018   CHOLHDL 3.0 08/29/2018       ASSESSMENT AND PLAN 68 y.o. year old female  has a past medical history of Allergic rhinitis, Arthritis, Insomnia, Osteopenia, Restless leg syndrome, Squamous cell carcinoma (9/06), and Squamous cell carcinoma (04/09/13). here with:  1.  Restless leg syndrome  The patient was advised that she can increase gabapentin taking 2 tablets twice a day.  She will continue on Sinemet.  She is advised that if her symptoms worsen or she develops new symptoms she should let us know.  She will follow-up in 1 year or sooner if needed.   I spent 15 minutes with the patient. 50% of this time was spent reviewing her plan of care   Ward Givens, MSN, NP-C 05/02/2019, 1:29 PM [facility] [address] [place], [place] [address] [phone]

## 2019-05-02 NOTE — Patient Instructions (Signed)
Your Plan:  Continue sinemet Can take gabapentin 2 tabs twice a day  Thank you for coming to see [place] at [facility]. I hope we have been able to provide you high quality care today.  You may receive a patient satisfaction survey over the next few weeks. We would appreciate your feedback and comments so that we may continue to improve ourselves and the health of our patients.

## 2019-06-06 ENCOUNTER — Telehealth: Payer: Self-pay | Admitting: Adult Health

## 2019-06-06 DIAGNOSIS — G2581 Restless legs syndrome: Secondary | ICD-10-CM

## 2019-06-06 NOTE — Telephone Encounter (Signed)
Landon @ pharmacy has called to inform pt going out of town on 09-24, she'd like to know if a 67 day script can be called in for pt so she doesn't go without

## 2019-06-07 MED ORDER — CARBIDOPA-LEVODOPA 25-100 MG PO TABS: ORAL_TABLET | ORAL | 1 refills | Status: DC

## 2019-06-07 NOTE — Addendum Note (Signed)
Addended by: Oliver Hum S on: 06/07/2019 10:00 AM   Modules accepted: Orders

## 2019-06-20 DIAGNOSIS — L570 Actinic keratosis: Secondary | ICD-10-CM | POA: Diagnosis not present

## 2019-06-20 DIAGNOSIS — Z85828 Personal history of other malignant neoplasm of skin: Secondary | ICD-10-CM | POA: Diagnosis not present

## 2019-08-07 DIAGNOSIS — L821 Other seborrheic keratosis: Secondary | ICD-10-CM | POA: Diagnosis not present

## 2019-08-07 DIAGNOSIS — Z85828 Personal history of other malignant neoplasm of skin: Secondary | ICD-10-CM | POA: Diagnosis not present

## 2019-08-07 DIAGNOSIS — L570 Actinic keratosis: Secondary | ICD-10-CM | POA: Diagnosis not present

## 2019-08-31 ENCOUNTER — Ambulatory Visit: Payer: PPO | Admitting: Certified Nurse Midwife

## 2019-09-06 DIAGNOSIS — F325 Major depressive disorder, single episode, in full remission: Secondary | ICD-10-CM | POA: Diagnosis not present

## 2019-09-06 DIAGNOSIS — E785 Hyperlipidemia, unspecified: Secondary | ICD-10-CM | POA: Diagnosis not present

## 2019-10-18 DIAGNOSIS — F325 Major depressive disorder, single episode, in full remission: Secondary | ICD-10-CM | POA: Diagnosis not present

## 2019-10-18 DIAGNOSIS — E785 Hyperlipidemia, unspecified: Secondary | ICD-10-CM | POA: Diagnosis not present

## 2019-10-29 DIAGNOSIS — Z85828 Personal history of other malignant neoplasm of skin: Secondary | ICD-10-CM | POA: Diagnosis not present

## 2019-10-29 DIAGNOSIS — L821 Other seborrheic keratosis: Secondary | ICD-10-CM | POA: Diagnosis not present

## 2019-10-29 DIAGNOSIS — L570 Actinic keratosis: Secondary | ICD-10-CM | POA: Diagnosis not present

## 2019-11-05 ENCOUNTER — Other Ambulatory Visit: Payer: Self-pay

## 2019-11-08 ENCOUNTER — Other Ambulatory Visit: Payer: Self-pay

## 2019-11-08 ENCOUNTER — Ambulatory Visit (INDEPENDENT_AMBULATORY_CARE_PROVIDER_SITE_OTHER): Payer: PPO | Admitting: Obstetrics & Gynecology

## 2019-11-08 ENCOUNTER — Encounter: Payer: Self-pay | Admitting: Obstetrics & Gynecology

## 2019-11-08 VITALS — BP 128/70 | HR 64 | Temp 97.2°F | Resp 10 | Ht 67.75 in | Wt 163.0 lb

## 2019-11-08 DIAGNOSIS — R7401 Elevation of levels of liver transaminase levels: Secondary | ICD-10-CM

## 2019-11-08 DIAGNOSIS — Z01419 Encounter for gynecological examination (general) (routine) without abnormal findings: Secondary | ICD-10-CM

## 2019-11-08 DIAGNOSIS — E785 Hyperlipidemia, unspecified: Secondary | ICD-10-CM | POA: Diagnosis not present

## 2019-11-08 NOTE — Progress Notes (Addendum)
69 y.o. G1P1 Married White or Caucasian female here for annual exam.  Doing well.  Denies vaginal bleeding.  Husband is only one year older than her.  He has lots of medical issues.  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    Has decided not to have the Covid vaccine.  Feels her husband needs it but he doesn't want to take it.  PCP:  Dr. Dema Severin.    Patient's last menstrual period was 09/28/1999.          Sexually active: No.  The current method of family planning is status post hysterectomy.    Exercising: Yes.    workout Smoker:  no  Health Maintenance: Pap:  04-24-13 neg History of abnormal Pap:  no MMG:  02/16/19 BIRADS 1 negative/density b Colonoscopy:  Addendum:  Received outside colonoscopy report.  Last colonoscopy 12/09/16.  Dr. Watt Climes.  Follow 5 years. BMD:   2019 Ostepenia TDaP:  2012 Pneumonia vaccine(s):  no Shingrix:   Zostavax completed Hep C testing: donated blood around 2017 Screening Labs: discuss today   reports that she has never smoked. She has never used smokeless tobacco. She reports current alcohol use of about 3.0 standard drinks of alcohol per week. She reports that she does not use drugs.  Past Medical History:  Diagnosis Date  . Allergic rhinitis   . Arthritis   . Insomnia   . Osteopenia   . Restless leg syndrome   . 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 Squamous cell carcinoma 04/09/13   left collar bone-about one inch from area removed in 2006    Past Surgical History:  Procedure Laterality Date  . ABDOMINAL HYSTERECTOMY  2001  . BUNIONECTOMY    . squamous cell carcinoma removed  9/06   left collar bone  . squamous cell carcinoma removed  7/14   left collar bone  . tubes in ear     as a child    Current Outpatient Medications  Medication Sig Dispense Refill  . aspirin 81 MG tablet Take 81 mg by mouth. 3 times a week    . BIOTIN PO Take by mouth.    Marland Kitchen CALCIUM PO Take 1,500 mg by mouth daily.    . carbidopa-levodopa (SINEMET IR) 25-100  MG tablet Take 1 tablet at 1pm, take 1 at 3:30 pm, 2 at 6:30pm & 1 at 9pm 450 tablet 1  . escitalopram (LEXAPRO) 10 MG tablet Take 1 tablet (10 mg total) by mouth daily. 90 tablet 3  . fexofenadine (ALLEGRA) 180 MG tablet Take 180 mg by mouth every other day.     . gabapentin (NEURONTIN) 100 MG capsule Take 2 capsules (200 mg total) by mouth 2 (two) times daily. 360 capsule 3  . IRON PO Take by mouth daily.    Marland Kitchen VITAMIN D, ERGOCALCIFEROL, PO Take 1 tablet by mouth daily.     No current facility-administered medications for this visit.    Family History  Problem Relation Age of Onset  . Emphysema Mother   . Asthma Mother   . Heart disease Mother   . Hypertension Mother     Review of Systems  All other systems reviewed and are negative.   Exam:   BP 128/70 (BP Location: Left Arm, Patient Position: Sitting, Cuff Size: Normal)   Pulse 64   Temp (!) 97.2 F (36.2 C) (Temporal)   Resp 10   Ht 5' 7.75" (1.721 m)   Wt 163 lb (73.9 kg)  LMP 09/28/1999   BMI 24.97 kg/m     Height: 5' 7.75" (172.1 cm)  Ht Readings from Last 3 Encounters:  11/08/19 5' 7.75" (1.721 m)  05/02/19 5' 7.75" (1.721 m)  08/29/18 5' 7.75" (1.721 m)   General appearance: alert, cooperative and appears stated age Head: Normocephalic, without obvious abnormality, atraumatic Neck: no adenopathy, supple, symmetrical, trachea midline and thyroid normal to inspection and palpation Lungs: clear to auscultation bilaterally Breasts: normal appearance, no masses or tenderness Heart: regular rate and rhythm Abdomen: soft, non-tender; bowel sounds normal; no masses,  no organomegaly Extremities: extremities normal, atraumatic, no cyanosis or edema Skin: Skin color, texture, turgor normal. No rashes or lesions Lymph nodes: Cervical, supraclavicular, and axillary nodes normal. No abnormal inguinal nodes palpated Neurologic: Grossly normal   Pelvic: External genitalia:  no lesions              Urethra:  normal  appearing urethra with no masses, tenderness or lesions              Bartholins and Skenes: normal                 Vagina: normal appearing vagina with normal color and discharge, no lesions              Cervix: absent              Pap taken: No. Bimanual Exam:  Uterus:  uterus absent              Adnexa: no mass, fullness, tenderness               Rectovaginal: Confirms               Anus:  normal sphincter tone, no lesions  Chaperone, Terence Lux, CMA, was present for exam.  A:  Well Woman with normal exam PMP, no HRT S/p TAH for bleeding RLS on Sinemet and gabapentin Osteopenia Mildly elevated lipids H/o elevated ALT  P:   Mammogram guidelines reviewed pap smear not indicated due to hysterectomy CMP and lipids obtained today (non fasting but pt desires to be done today) Plan BMD 1-2 years Release of colonoscopy from Dr. Watt Climes signed today  (addendum:  Colonoscopy 12/09/2016.  Follow up 5 years.  Pt will be notified.) Vaccines reviewed.  Pneumonia vaccinations discussed.   Return annually or prn

## 2019-11-09 LAB — COMPREHENSIVE METABOLIC PANEL
ALT: 27 IU/L (ref 0–32)
AST: 31 IU/L (ref 0–40)
Albumin/Globulin Ratio: 2.1 (ref 1.2–2.2)
Albumin: 4.5 g/dL (ref 3.8–4.8)
Alkaline Phosphatase: 96 IU/L (ref 39–117)
BUN/Creatinine Ratio: 19 (ref 12–28)
BUN: 16 mg/dL (ref 8–27)
Bilirubin Total: 0.4 mg/dL (ref 0.0–1.2)
CO2: 27 mmol/L (ref 20–29)
Calcium: 9.6 mg/dL (ref 8.7–10.3)
Chloride: 99 mmol/L (ref 96–106)
Creatinine, Ser: 0.85 mg/dL (ref 0.57–1.00)
GFR calc Af Amer: 81 mL/min/{1.73_m2} (ref >59)
GFR calc non Af Amer: 71 mL/min/{1.73_m2} (ref >59)
Globulin, Total: 2.1 g/dL (ref 1.5–4.5)
Glucose: 71 mg/dL (ref 65–99)
Potassium: 4 mmol/L (ref 3.5–5.2)
Sodium: 138 mmol/L (ref 134–144)
Total Protein: 6.6 g/dL (ref 6.0–8.5)

## 2019-11-09 LAB — LIPID PANEL
Chol/HDL Ratio: 3 ratio (ref 0.0–4.4)
Cholesterol, Total: 241 mg/dL — ABNORMAL HIGH (ref 100–199)
HDL: 80 mg/dL (ref >39)
LDL Chol Calc (NIH): 146 mg/dL — ABNORMAL HIGH (ref 0–99)
Triglycerides: 89 mg/dL (ref 0–149)
VLDL Cholesterol Cal: 15 mg/dL (ref 5–40)

## 2019-11-20 DIAGNOSIS — E785 Hyperlipidemia, unspecified: Secondary | ICD-10-CM | POA: Diagnosis not present

## 2019-11-20 DIAGNOSIS — F325 Major depressive disorder, single episode, in full remission: Secondary | ICD-10-CM | POA: Diagnosis not present

## 2019-11-28 ENCOUNTER — Other Ambulatory Visit: Payer: Self-pay | Admitting: Obstetrics & Gynecology

## 2019-11-28 NOTE — Telephone Encounter (Signed)
Patient is asking for a refill of generic Flonase to [facility].

## 2019-11-29 MED ORDER — FLUTICASONE PROPIONATE 50 MCG/ACT NA SUSP: 2.0000 | Freq: Every day | NASAL | 3 refills | Status: DC

## 2019-11-29 NOTE — Telephone Encounter (Signed)
Medication refill request: Fluticasone Last AEX:  08/02/17 DL Next AEX: 02/03/21 Last MMG (if hormonal medication request): 02/16/19 BIRADS 1 negative/density b Refill authorized: Please advise on refill

## 2019-12-11 ENCOUNTER — Other Ambulatory Visit: Payer: Self-pay | Admitting: Adult Health

## 2019-12-11 NOTE — Telephone Encounter (Signed)
Called pharmacy, spoke with pharmacist Gerald Stabs who stated patient had been calling in old gabapentin Rx. He did find Rx sent 05/02/19 and will get refill ready for her. He stated she needs Lexapro refill, will send request. He will notify her when refills are ready.

## 2019-12-11 NOTE — Telephone Encounter (Signed)
Received 2 pharmacy requests to refill Gabapentin. Last Rx Aug for 90 day supply with 3 refills. Made several attempts to reach pharmacy, immediately rings busy.

## 2019-12-12 ENCOUNTER — Other Ambulatory Visit: Payer: Self-pay | Admitting: Neurology

## 2019-12-12 MED ORDER — ESCITALOPRAM OXALATE 10 MG PO TABS: 10.0000 mg | ORAL_TABLET | Freq: Every day | ORAL | 1 refills | Status: DC

## 2019-12-17 DIAGNOSIS — D485 Neoplasm of uncertain behavior of skin: Secondary | ICD-10-CM | POA: Diagnosis not present

## 2019-12-17 DIAGNOSIS — L570 Actinic keratosis: Secondary | ICD-10-CM | POA: Diagnosis not present

## 2019-12-17 DIAGNOSIS — L814 Other melanin hyperpigmentation: Secondary | ICD-10-CM | POA: Diagnosis not present

## 2019-12-17 DIAGNOSIS — Z85828 Personal history of other malignant neoplasm of skin: Secondary | ICD-10-CM | POA: Diagnosis not present

## 2019-12-17 DIAGNOSIS — D1801 Hemangioma of skin and subcutaneous tissue: Secondary | ICD-10-CM | POA: Diagnosis not present

## 2019-12-17 DIAGNOSIS — L812 Freckles: Secondary | ICD-10-CM | POA: Diagnosis not present

## 2019-12-17 DIAGNOSIS — L821 Other seborrheic keratosis: Secondary | ICD-10-CM | POA: Diagnosis not present

## 2019-12-18 ENCOUNTER — Encounter: Payer: Self-pay | Admitting: Certified Nurse Midwife

## 2019-12-20 ENCOUNTER — Ambulatory Visit: Payer: PPO | Attending: Internal Medicine

## 2020-01-03 DIAGNOSIS — G4700 Insomnia, unspecified: Secondary | ICD-10-CM | POA: Diagnosis not present

## 2020-01-03 DIAGNOSIS — E785 Hyperlipidemia, unspecified: Secondary | ICD-10-CM | POA: Diagnosis not present

## 2020-01-03 DIAGNOSIS — F325 Major depressive disorder, single episode, in full remission: Secondary | ICD-10-CM | POA: Diagnosis not present

## 2020-02-26 DIAGNOSIS — Z1231 Encounter for screening mammogram for malignant neoplasm of breast: Secondary | ICD-10-CM | POA: Diagnosis not present

## 2020-03-04 ENCOUNTER — Encounter: Payer: Self-pay | Admitting: Obstetrics & Gynecology

## 2020-03-27 DIAGNOSIS — S70362A Insect bite (nonvenomous), left thigh, initial encounter: Secondary | ICD-10-CM | POA: Diagnosis not present

## 2020-03-27 DIAGNOSIS — Z85828 Personal history of other malignant neoplasm of skin: Secondary | ICD-10-CM | POA: Diagnosis not present

## 2020-03-27 DIAGNOSIS — L820 Inflamed seborrheic keratosis: Secondary | ICD-10-CM | POA: Diagnosis not present

## 2020-05-05 ENCOUNTER — Ambulatory Visit: Payer: PPO | Admitting: Adult Health

## 2020-05-05 DIAGNOSIS — G2581 Restless legs syndrome: Secondary | ICD-10-CM

## 2020-05-05 MED ORDER — GABAPENTIN 100 MG PO CAPS: ORAL_CAPSULE | ORAL | 3 refills | Status: DC

## 2020-05-05 MED ORDER — CARBIDOPA-LEVODOPA 25-100 MG PO TABS: ORAL_TABLET | ORAL | 3 refills | Status: DC

## 2020-05-05 NOTE — Progress Notes (Signed)
PATIENT: Erin Shepherd DOB: July 02, 1951  REASON FOR VISIT: follow up HISTORY FROM: patient  HISTORY OF PRESENT ILLNESS: Today 05/05/20:  Erin Shepherd is a 69 year old female with a history of restless leg syndrome.  She returns today for follow-up.  She is currently on gabapentin taking 1 tablet at 5 PM and 2 tablets at 630 and again at 8 PM.  She also takes Sinemet 2 tablets at 5 PM and 1 tablet at 630.  She also started on iron supplements.  She states that her restless leg has been under relatively good control.  She returns today for an evaluation.  HISTORY  05/02/19:  Erin Shepherd is a 69 year old female with a history of restless leg syndrome.  She returns today for follow-up.  At the last visit gabapentin was increased to 200 mg 3 times a day.  She reports that she has been taking gabapentin 1 tablet at 515 2 tablets before bedtime.  She states that if she sits in her recliner her symptoms are typically controlled.  But if she tries to recline with her feet up within 20 minutes she begins to have symptoms.  She has not tried increasing her afternoon dose of gabapentin.  She continues to take Sinemet 3-4 times a day.  She returns today for an evaluation.  REVIEW OF SYSTEMS: Out of a complete 14 system review of symptoms, the patient complains only of the following symptoms, and all other reviewed systems are negative.  See HPI  ALLERGIES: Allergies  Allergen Reactions  . Sulfonamide Derivatives     HOME MEDICATIONS: Outpatient Medications Prior to Visit  Medication Sig Dispense Refill  . aspirin 81 MG tablet Take 81 mg by mouth. 3 times a week    . BIOTIN PO Take by mouth.    Marland Kitchen CALCIUM PO Take 1,500 mg by mouth daily.    . carbidopa-levodopa (SINEMET IR) 25-100 MG tablet Take 1 tablet at 1pm, take 1 at 3:30 pm, 2 at 6:30pm & 1 at 9pm (Patient taking differently: Pt taking 3 tab daily.) 450 tablet 1  . escitalopram (LEXAPRO) 10 MG tablet Take 1 tablet (10 mg total) by mouth daily.  90 tablet 1  . fexofenadine (ALLEGRA) 180 MG tablet Take 180 mg by mouth every other day.     . fluticasone (FLONASE) 50 MCG/ACT nasal spray Place 2 sprays into both nostrils daily. 16 g 3  . gabapentin (NEURONTIN) 100 MG capsule Take 2 capsules (200 mg total) by mouth 2 (two) times daily. 360 capsule 3  . IRON PO Take by mouth daily.    Marland Kitchen VITAMIN D, ERGOCALCIFEROL, PO Take 1 tablet by mouth daily.     No facility-administered medications prior to visit.    PAST MEDICAL HISTORY: Past Medical History:  Diagnosis Date  . Allergic rhinitis   . Arthritis   . Insomnia   . Osteopenia   . Restless leg syndrome   . 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 Squamous cell carcinoma 04/09/2013   left collar bone-about one inch from area removed in 2006    PAST SURGICAL HISTORY: Past Surgical History:  Procedure Laterality Date  . ABDOMINAL HYSTERECTOMY  2001  . BUNIONECTOMY    . squamous cell carcinoma removed  05/2005   left collar bone, reexcision 7/14  . squamous cell carcinoma removed Right 02/2019   hand  . tubes in ear     as a child    FAMILY HISTORY: Family History  Problem Relation Age of Onset  . Emphysema Mother   . Asthma Mother   . Heart disease Mother   . Hypertension Mother     SOCIAL HISTORY: Social History   Socioeconomic History  . Marital status: Married    Spouse name: Not on file  . Number of children: Y  . Years of education: Not on file  . Highest education level: Not on file  Occupational History  . Not on file  Tobacco Use  . Smoking status: Never Smoker  . Smokeless tobacco: Never Used  Vaping Use  . Vaping Use: Never used  Substance and Sexual Activity  . Alcohol use: Yes    Alcohol/week: 3.0 standard drinks    Types: 3 Standard drinks or equivalent per week  . Drug use: No  . Sexual activity: Not Currently    Partners: Male    Birth control/protection: Surgical    Comment: TAH  Other Topics Concern  . Not on file   Social History Narrative  . Not on file   Social Determinants of Health   Financial Resource Strain:   . Difficulty of Paying Living Expenses:   Food Insecurity:   . Worried About Charity fundraiser in the Last Year:   . Arboriculturist in the Last Year:   Transportation Needs:   . Film/video editor (Medical):   Marland Kitchen Lack of Transportation (Non-Medical):   Physical Activity:   . Days of Exercise per Week:   . Minutes of Exercise per Session:   Stress:   . Feeling of Stress :   Social Connections:   . Frequency of Communication with Friends and Family:   . Frequency of Social Gatherings with Friends and Family:   . Attends Religious Services:   . Active Member of Clubs or Organizations:   . Attends Archivist Meetings:   Marland Kitchen Marital Status:   Intimate Partner Violence:   . Fear of Current or Ex-Partner:   . Emotionally Abused:   Marland Kitchen Physically Abused:   . Sexually Abused:       PHYSICAL EXAM  Vitals:   05/05/20 1354  BP: 128/79  Pulse: 65  Weight: 157 lb (71.2 kg)  Height: 5\' 8"  (1.727 m)   Body mass index is 23.87 kg/m.  Generalized: Well developed, in no acute distress   Neurological examination  Mentation: Alert oriented to time, place, history taking. Follows all commands speech and language fluent Cranial nerve II-XII: Pupils were equal round reactive to light. Extraocular movements were full, visual field were full on confrontational test. Head turning and shoulder shrug  were normal and symmetric. Motor: The motor testing reveals 5 over 5 strength of all 4 extremities. Good symmetric motor tone is noted throughout.  Sensory: Sensory testing is intact to soft touch on all 4 extremities. No evidence of extinction is noted.  Coordination: Cerebellar testing reveals good finger-nose-finger and heel-to-shin bilaterally.  Gait and station: Gait is normal.  Reflexes: Deep tendon reflexes are symmetric and normal bilaterally.   DIAGNOSTIC DATA (LABS,  IMAGING, TESTING) - I reviewed patient records, labs, notes, testing and imaging myself where available.  Lab Results  Component Value Date   WBC 5.2 10/03/2018   HGB 13.4 10/03/2018   HCT 39.2 10/03/2018   MCV 90 10/03/2018   PLT 228 10/03/2018      Component Value Date/Time   NA 138 11/08/2019 0947   K 4.0 11/08/2019 0947   CL 99 11/08/2019 0947  CO2 27 11/08/2019 0947   GLUCOSE 71 11/08/2019 0947   BUN 16 11/08/2019 0947   CREATININE 0.85 11/08/2019 0947   CALCIUM 9.6 11/08/2019 0947   PROT 6.6 11/08/2019 0947   ALBUMIN 4.5 11/08/2019 0947   AST 31 11/08/2019 0947   ALT 27 11/08/2019 0947   ALKPHOS 96 11/08/2019 0947   BILITOT 0.4 11/08/2019 0947   GFRNONAA 71 11/08/2019 0947   GFRAA 81 11/08/2019 0947   Lab Results  Component Value Date   CHOL 241 (H) 11/08/2019   HDL 80 11/08/2019   LDLCALC 146 (H) 11/08/2019   TRIG 89 11/08/2019   CHOLHDL 3.0 11/08/2019   No results found for: HGBA1C No results found for: VITAMINB12 Lab Results  Component Value Date   TSH 1.320 08/29/2018      ASSESSMENT AND PLAN 69 y.o. year old female  has a past medical history of Allergic rhinitis, Arthritis, Insomnia, Osteopenia, Restless leg syndrome, Squamous cell carcinoma (9/06), and Squamous cell carcinoma (04/09/2013). here with:  Restless leg syndrome   Continue gabapentin and Sinemet  Advised if symptoms worsen or she develops new symptoms she should let us know  Follow-up in 6 months or sooner if needed   I spent 20 minutes of face-to-face and non-face-to-face time with patient.  This included previsit chart review, lab review, study review, order entry, electronic health record documentation, patient education.  Ward Givens, MSN, NP-C 05/05/2020, 2:02 PM [facility] [address], [place], [place] [address] [phone]

## 2020-05-05 NOTE — Patient Instructions (Signed)
Your Plan:  Continue Gabapentin and Sinemet If your symptoms worsen or you develop new symptoms please let us know.   Thank you for coming to see [place] at [facility]. I hope we have been able to provide you high quality care today.  You may receive a patient satisfaction survey over the next few weeks. We would appreciate your feedback and comments so that we may continue to improve ourselves and the health of our patients.

## 2020-06-12 IMAGING — CR DG CHEST 2V
2 series · 2 of 2 positions shown · non-contrast
Comparison: None.

CLINICAL DATA: Pneumonia.  Congestion, wheezing.

EXAM:
CHEST - 2 VIEW

[w chest pa]
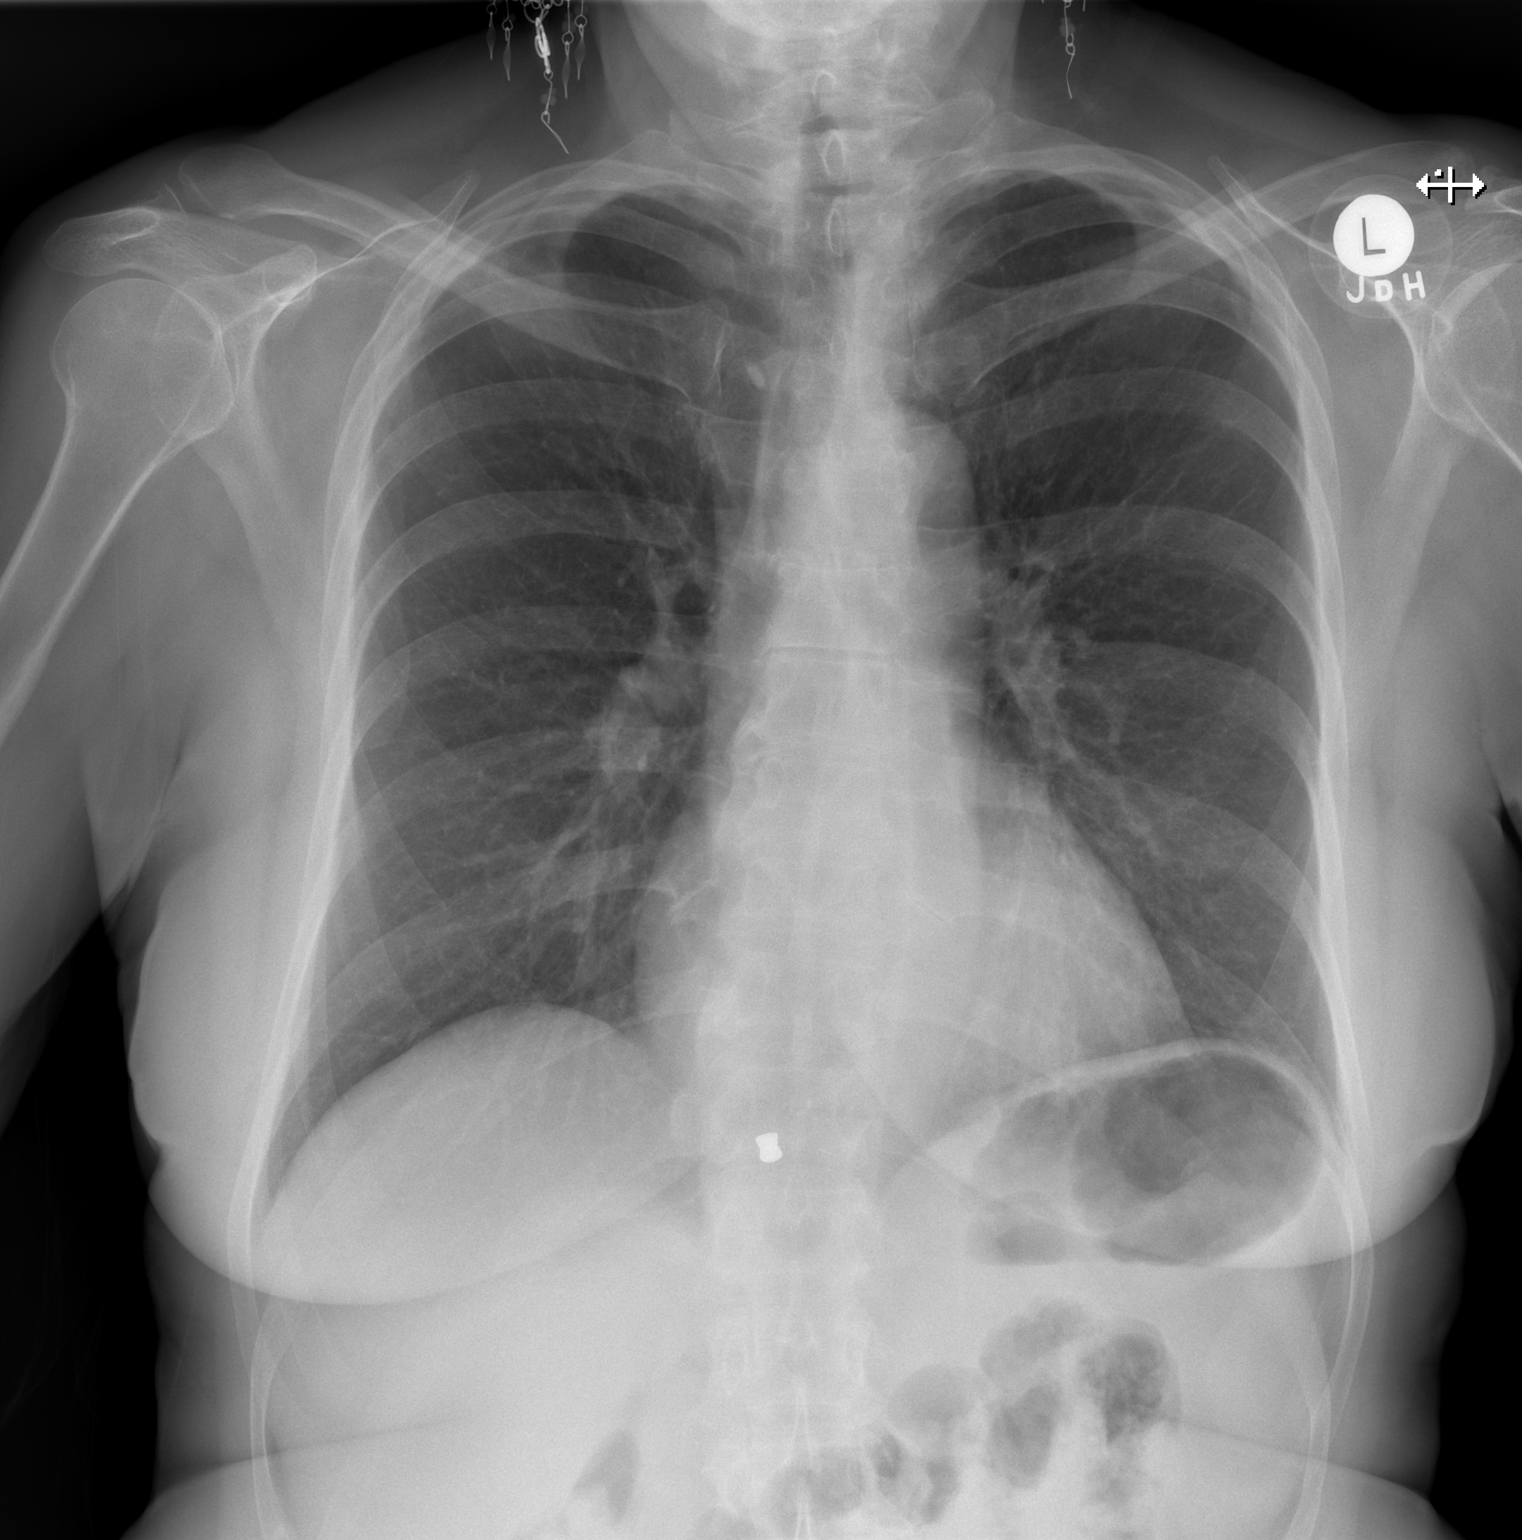

[w chest lat]
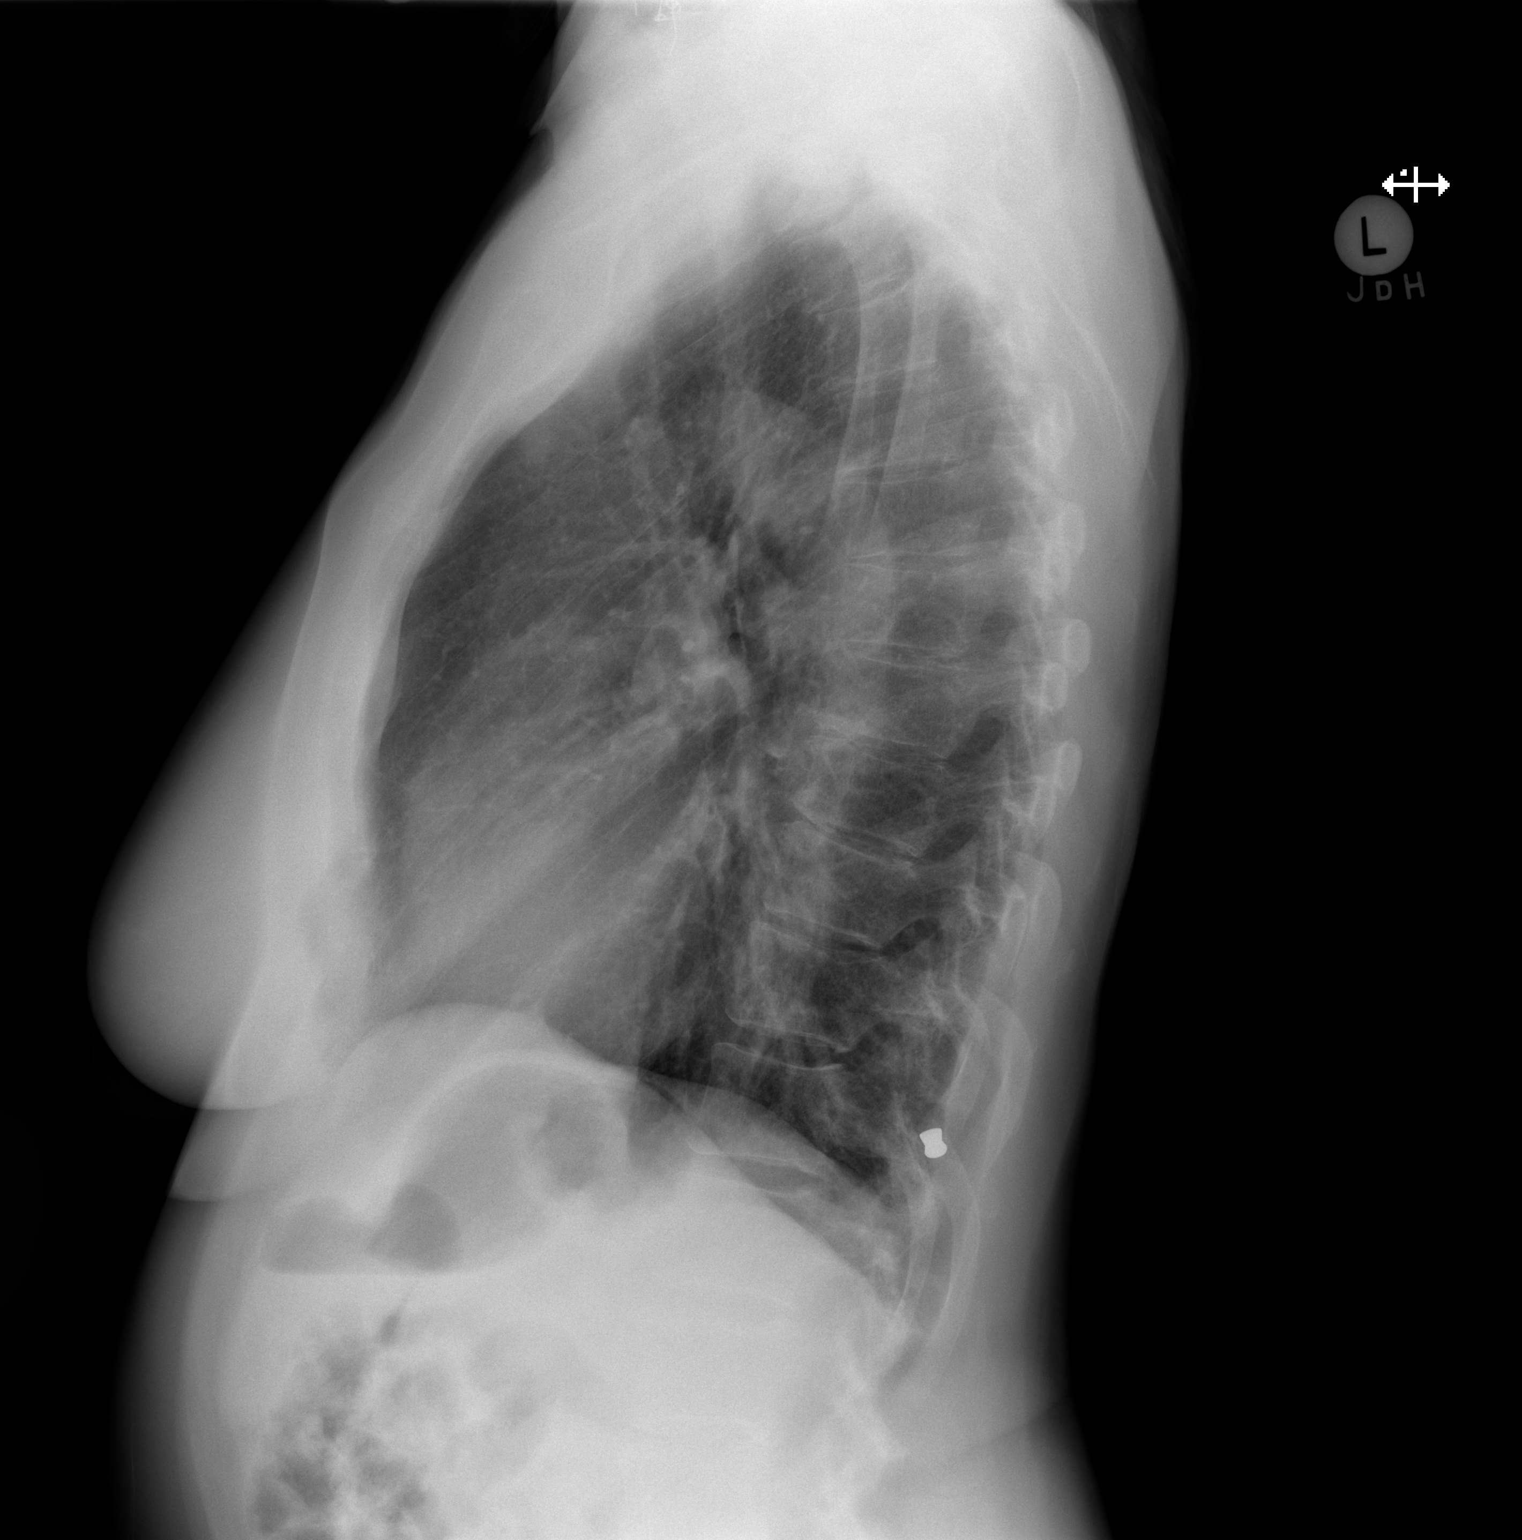

[2 of 2 positions shown; findings below may reference images not displayed]

FINDINGS: Heart and mediastinal contours are within normal limits. No focal
opacities or effusions. No acute bony abnormality.
IMPRESSION: No active cardiopulmonary disease.

## 2020-06-23 ENCOUNTER — Other Ambulatory Visit: Payer: Self-pay | Admitting: Adult Health

## 2020-06-23 ENCOUNTER — Other Ambulatory Visit: Payer: Self-pay | Admitting: Neurology

## 2020-06-23 DIAGNOSIS — G2581 Restless legs syndrome: Secondary | ICD-10-CM

## 2020-06-23 MED ORDER — ESCITALOPRAM OXALATE 10 MG PO TABS: 10.0000 mg | ORAL_TABLET | Freq: Every day | ORAL | 1 refills | Status: DC

## 2020-07-04 DIAGNOSIS — G4700 Insomnia, unspecified: Secondary | ICD-10-CM | POA: Diagnosis not present

## 2020-07-04 DIAGNOSIS — E785 Hyperlipidemia, unspecified: Secondary | ICD-10-CM | POA: Diagnosis not present

## 2020-07-04 DIAGNOSIS — F325 Major depressive disorder, single episode, in full remission: Secondary | ICD-10-CM | POA: Diagnosis not present

## 2020-08-11 DIAGNOSIS — H5213 Myopia, bilateral: Secondary | ICD-10-CM | POA: Diagnosis not present

## 2020-09-26 ENCOUNTER — Other Ambulatory Visit: Payer: Self-pay | Admitting: Adult Health

## 2020-09-26 DIAGNOSIS — G2581 Restless legs syndrome: Secondary | ICD-10-CM

## 2020-10-09 DIAGNOSIS — L570 Actinic keratosis: Secondary | ICD-10-CM | POA: Diagnosis not present

## 2020-10-09 DIAGNOSIS — L821 Other seborrheic keratosis: Secondary | ICD-10-CM | POA: Diagnosis not present

## 2020-10-09 DIAGNOSIS — Z85828 Personal history of other malignant neoplasm of skin: Secondary | ICD-10-CM | POA: Diagnosis not present

## 2020-11-03 ENCOUNTER — Other Ambulatory Visit: Payer: Self-pay | Admitting: Neurology

## 2020-11-03 MED ORDER — ESCITALOPRAM OXALATE 10 MG PO TABS: 10.0000 mg | ORAL_TABLET | Freq: Every day | ORAL | 1 refills | Status: DC

## 2020-12-02 ENCOUNTER — Other Ambulatory Visit: Payer: Self-pay

## 2020-12-02 ENCOUNTER — Encounter (HOSPITAL_BASED_OUTPATIENT_CLINIC_OR_DEPARTMENT_OTHER): Payer: Self-pay | Admitting: Obstetrics & Gynecology

## 2020-12-02 ENCOUNTER — Ambulatory Visit (INDEPENDENT_AMBULATORY_CARE_PROVIDER_SITE_OTHER): Payer: PPO | Admitting: Obstetrics & Gynecology

## 2020-12-02 VITALS — BP 130/76 | HR 64 | Ht 68.0 in | Wt 155.6 lb

## 2020-12-02 DIAGNOSIS — E7800 Pure hypercholesterolemia, unspecified: Secondary | ICD-10-CM | POA: Diagnosis not present

## 2020-12-02 DIAGNOSIS — J302 Other seasonal allergic rhinitis: Secondary | ICD-10-CM

## 2020-12-02 DIAGNOSIS — Z01419 Encounter for gynecological examination (general) (routine) without abnormal findings: Secondary | ICD-10-CM | POA: Diagnosis not present

## 2020-12-02 DIAGNOSIS — G2581 Restless legs syndrome: Secondary | ICD-10-CM

## 2020-12-02 MED ORDER — FLUTICASONE PROPIONATE 50 MCG/ACT NA SUSP: 2.0000 | Freq: Every day | NASAL | 3 refills | Status: DC

## 2020-12-02 NOTE — Progress Notes (Signed)
70 y.o. G1P1 Married White or Caucasian female here for breast and pelvic exam.  She and spouse are having some struggles.  They are not currently living together.  Last year her cholesterol was elevated and her LDLs were elevated.  She did not follow up with her PCP.  States she did not see Dr. Dema Severin last year.    Denies vaginal bleeding.  Seeing Dr. Brett Fairy for restless leg syndrome.  On Sinemet.  Would like to have lipid level checked today.  Also, requests flonase RF.  Doesn't have appt with PCP scheduled at this time.  Allergies have started with the weather warming and uses this seasonally with good success.    Patient's last menstrual period was 09/28/1999.          Sexually active: No. He has erectile dysfunction. H/O STD:  no  Health Maintenance: PCP:  Dr. Harlan Stains.  Last wellness appt was 06/2020.  Did blood work at that appt: yes Vaccines are up to date:  Pneumonia not documented.  Pt thinks done.  She will check with Dr. Dema Severin Colonoscopy:  12/09/2016, Dr. Watt Climes.  Follow up 5 years.   MMG:  02/26/2020  BMD:  2019 osteopenia, -2.0.   Last pap smear:  2014.   H/o abnormal pap smear:  no   reports that she has never smoked. She has never used smokeless tobacco. She reports current alcohol use of about 3.0 standard drinks of alcohol per week. She reports that she does not use drugs.  Past Medical History:  Diagnosis Date  . Allergic rhinitis   . Arthritis   . Insomnia   . Osteopenia   . Restless leg syndrome   . 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 Squamous cell carcinoma 04/09/2013   left collar bone-about one inch from area removed in 2006    Past Surgical History:  Procedure Laterality Date  . ABDOMINAL HYSTERECTOMY  2001  . BUNIONECTOMY    . squamous cell carcinoma removed  05/2005   left collar bone, reexcision 7/14  . squamous cell carcinoma removed Right 02/2019   hand  . tubes in ear     as a child    Current Outpatient Medications   Medication Sig Dispense Refill  . aspirin 81 MG tablet Take 81 mg by mouth. 3 times a week    . BIOTIN PO Take by mouth.    Marland Kitchen CALCIUM PO Take 1,500 mg by mouth daily.    Marland Kitchen CALCIUM-MAGNESIUM-ZINC PO Take by mouth.    . carbidopa-levodopa (SINEMET IR) 25-100 MG tablet TAKE 2 TABLETS BY MOUTH AT 5 PM AND 1 TABLET AT 6:30PM 90 tablet 3  . escitalopram (LEXAPRO) 10 MG tablet Take 1 tablet (10 mg total) by mouth daily. 90 tablet 1  . fexofenadine (ALLEGRA) 180 MG tablet Take 180 mg by mouth every other day.     . fluticasone (FLONASE) 50 MCG/ACT nasal spray Place 2 sprays into both nostrils daily. 16 g 3  . gabapentin (NEURONTIN) 100 MG capsule Take 1 tablet Po at 5, 2 tablets PO at 6:30 and 8PM. 450 capsule 3  . IRON PO Take by mouth daily.    Marland Kitchen VITAMIN D, ERGOCALCIFEROL, PO Take 1 tablet by mouth daily.     No current facility-administered medications for this visit.    Family History  Problem Relation Age of Onset  . Emphysema Mother   . Asthma Mother   . Heart disease Mother   .  Hypertension Mother     Review of Systems  Gastrointestinal: Negative.   Genitourinary: Negative.   All other systems reviewed and are negative.   Exam:   BP 130/76   Pulse 64   Ht 5\' 8"  (1.727 m)   Wt 155 lb 9.6 oz (70.6 kg)   LMP 09/28/1999   SpO2 100%   BMI 23.66 kg/m   Height: 5\' 8"  (172.7 cm)  General appearance: alert, cooperative and appears stated age Breasts: normal appearance, no masses or tenderness Abdomen: soft, non-tender; bowel sounds normal; no masses,  no organomegaly Lymph nodes: Cervical, supraclavicular, and axillary nodes normal.  No abnormal inguinal nodes palpated Neurologic: Grossly normal  Pelvic: External genitalia:  no lesions              Urethra:  normal appearing urethra with no masses, tenderness or lesions              Bartholins and Skenes: normal                 Vagina: normal appearing vagina with normal color and discharge, no lesions              Cervix:  surgically absent              Pap taken: No. Bimanual Exam:  Uterus:  uterus absent              Adnexa: no mass, fullness, tenderness               Rectovaginal: Confirms               Anus:  normal sphincter tone, no lesions  Chaperone, Britt Bottom, CMA, was present for exam.  Assessment/Plan: 1. Encntr for gyn exam (general) (routine) w/o abn findings - pap smear not indicated due to h/o hysterectomy - MMG 02/2020 - Colonoscopy 3/18, due next year - BMD discussed.  She does not want to do it this year.  Plan to repeat next year. - lipids ordered today - vaccines reviewed.  Pt will check with Dr. Dema Severin about pneumonia vaccination.  2. Elevated cholesterol - Lipid panel; Future  3. RESTLESS LEGS SYNDROME  4. Seasonal allergies - fluticasone (FLONASE) 50 MCG/ACT nasal spray; Place 2 sprays into both nostrils daily.  Dispense: 16 g; Refill: 3

## 2020-12-02 NOTE — Patient Instructions (Signed)
1) have you done a pneumonia vaccination 2) you will need your tetanus updated later this

## 2020-12-08 ENCOUNTER — Other Ambulatory Visit: Payer: Self-pay

## 2020-12-08 ENCOUNTER — Other Ambulatory Visit (HOSPITAL_BASED_OUTPATIENT_CLINIC_OR_DEPARTMENT_OTHER)
Admission: RE | Admit: 2020-12-08 | Discharge: 2020-12-08 | Disposition: A | Discharge status: Home / Self Care | Payer: PPO | Source: Ambulatory Visit | Attending: Obstetrics & Gynecology | Admitting: Obstetrics & Gynecology

## 2020-12-08 DIAGNOSIS — E7800 Pure hypercholesterolemia, unspecified: Secondary | ICD-10-CM | POA: Diagnosis not present

## 2020-12-08 LAB — LIPID PANEL
Cholesterol: 265 mg/dL — ABNORMAL HIGH (ref 0–200)
HDL: 74 mg/dL (ref >40)
LDL Cholesterol: 175 mg/dL — ABNORMAL HIGH (ref 0–99)
Total CHOL/HDL Ratio: 3.6 RATIO
Triglycerides: 81 mg/dL (ref <150)
VLDL: 16 mg/dL (ref 0–40)

## 2020-12-11 ENCOUNTER — Encounter: Payer: Self-pay | Admitting: Orthopedic Surgery

## 2020-12-11 ENCOUNTER — Other Ambulatory Visit: Payer: Self-pay

## 2020-12-11 ENCOUNTER — Ambulatory Visit (INDEPENDENT_AMBULATORY_CARE_PROVIDER_SITE_OTHER): Payer: PPO

## 2020-12-11 ENCOUNTER — Ambulatory Visit: Payer: PPO | Admitting: Orthopedic Surgery

## 2020-12-11 DIAGNOSIS — M6702 Short Achilles tendon (acquired), left ankle: Secondary | ICD-10-CM

## 2020-12-11 DIAGNOSIS — M79672 Pain in left foot: Secondary | ICD-10-CM | POA: Diagnosis not present

## 2020-12-11 DIAGNOSIS — M76822 Posterior tibial tendinitis, left leg: Secondary | ICD-10-CM | POA: Diagnosis not present

## 2020-12-11 NOTE — Progress Notes (Signed)
Office Visit Note   Patient: Erin Shepherd           Date of Birth: Sep 18, 1951           MRN: [id number] Visit Date: 12/11/2020              Requested by: Harlan Stains, MD [address] [place],  [place] [id number] PCP: Harlan Stains, MD  Chief Complaint  Patient presents with  . Left Foot - Pain      HPI: Patient is a 70 year old woman who presents with worsening pain base of the first metatarsal left foot she states she has had to decrease her activities due to this pain.   Assessment & Plan: Visit Diagnoses:  1. Pain in left foot   2. Posterior tibial tendinitis, left leg     Plan: Discussed that patient does have tendinitis at the insertion of the posterior tibial tendon recommended a stiff orthotic stiff soled sneaker Achilles stretching Voltaren gel and plantar fascial strengthening.  Follow-Up Instructions: Return if symptoms worsen or fail to improve.   Ortho Exam  Patient is alert, oriented, no adenopathy, well-dressed, normal affect, normal respiratory effort. Examination patient has a high arch with sitting to standing she has pes planus with posterior tibial tendon insufficiency she has pain reproduced with a single limb heel raise.  She does not have pain proximal but only at the insertion of the posterior tibial tendon she does have Achilles contracture with dorsiflexion only to neutral.  She is tender to palpation of the insertion of the posterior tibial tendon.  Imaging: XR Foot 2 Views Left  Result Date: 12/11/2020 2 view radiographs of the left foot shows no bony abnormalities no bone spurs no fractures  No images are attached to the encounter.  Labs: Lab Results  Component Value Date   LABORGA ESCHERICHIA COLI 09/23/2012     Lab Results  Component Value Date   ALBUMIN 4.5 11/08/2019   ALBUMIN 4.4 08/29/2018    No results found for: MG Lab Results  Component Value Date   VD25OH 72.8 08/29/2018   VD25OH 41.3 08/02/2017    VD25OH 48 07/14/2016    No results found for: PREALBUMIN CBC EXTENDED Latest Ref Rng & Units 10/03/2018 08/29/2018 07/04/2014  WBC 3.4 - 10.8 x10E3/uL 5.2 5.3 -  RBC 3.77 - 5.28 x10E6/uL 4.34 4.37 -  HGB 11.1 - 15.9 g/dL 13.4 13.2 13.4  HCT 34.0 - 46.6 % 39.2 39.5 -  PLT 150 - 450 x10E3/uL 228 207 -  NEUTROABS 1.4 - 7.0 x10E3/uL 3.5 3.4 -  LYMPHSABS 0.7 - 3.1 x10E3/uL 1.2 1.0 -     There is no height or weight on file to calculate BMI.  Orders:  Orders Placed This Encounter  Procedures  . XR Foot 2 Views Left   No orders of the defined types were placed in this encounter.    Procedures: No procedures performed  Clinical Data: No additional findings.  ROS:  All other systems negative, except as noted in the HPI. Review of Systems  Objective: Vital Signs: LMP 09/28/1999   Specialty Comments:  No specialty comments available.  PMFS History: Patient Active Problem List   Diagnosis Date Noted  . Syncope 07/08/2016  . Low serum ferritin level 10/29/2015  . Insomnia due to medical condition 10/29/2015  . RESTLESS LEGS SYNDROME 08/02/2008  . ALLERGIC RHINITIS 08/01/2008  . INSOMNIA 08/01/2008   Past Medical History:  Diagnosis Date  . Allergic rhinitis   .  Arthritis   . Insomnia   . Osteopenia   . Restless leg syndrome   . 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 Squamous cell carcinoma 04/09/2013   left collar bone-about one inch from area removed in 2006    Family History  Problem Relation Age of Onset  . Emphysema Mother   . Asthma Mother   . Heart disease Mother   . Hypertension Mother     Past Surgical History:  Procedure Laterality Date  . ABDOMINAL HYSTERECTOMY  2001  . BUNIONECTOMY    . squamous cell carcinoma removed  05/2005   left collar bone, reexcision 7/14  . squamous cell carcinoma removed Right 02/2019   hand  . tubes in ear     as a child   Social History   Occupational History  . Not on file  Tobacco Use  . Smoking  status: Never Smoker  . Smokeless tobacco: Never Used  Vaping Use  . Vaping Use: Never used  Substance and Sexual Activity  . Alcohol use: Yes    Alcohol/week: 3.0 standard drinks    Types: 3 Standard drinks or equivalent per week  . Drug use: No  . Sexual activity: Not Currently    Partners: Male    Birth control/protection: Surgical    Comment: TAH

## 2020-12-16 DIAGNOSIS — L905 Scar conditions and fibrosis of skin: Secondary | ICD-10-CM | POA: Diagnosis not present

## 2020-12-16 DIAGNOSIS — L821 Other seborrheic keratosis: Secondary | ICD-10-CM | POA: Diagnosis not present

## 2020-12-16 DIAGNOSIS — L570 Actinic keratosis: Secondary | ICD-10-CM | POA: Diagnosis not present

## 2020-12-16 DIAGNOSIS — L814 Other melanin hyperpigmentation: Secondary | ICD-10-CM | POA: Diagnosis not present

## 2020-12-16 DIAGNOSIS — Z85828 Personal history of other malignant neoplasm of skin: Secondary | ICD-10-CM | POA: Diagnosis not present

## 2020-12-16 DIAGNOSIS — L578 Other skin changes due to chronic exposure to nonionizing radiation: Secondary | ICD-10-CM | POA: Diagnosis not present

## 2020-12-16 DIAGNOSIS — B070 Plantar wart: Secondary | ICD-10-CM | POA: Diagnosis not present

## 2020-12-16 DIAGNOSIS — L438 Other lichen planus: Secondary | ICD-10-CM | POA: Diagnosis not present

## 2020-12-24 ENCOUNTER — Telehealth: Payer: Self-pay

## 2020-12-24 NOTE — Telephone Encounter (Signed)
Can you please call pt and make an appt. Can eval in office to see what is going on and if any changes in treatment are needed.
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
## 2020-12-25 ENCOUNTER — Ambulatory Visit: Payer: PPO | Admitting: Physician Assistant

## 2020-12-25 ENCOUNTER — Encounter: Payer: Self-pay | Admitting: Orthopedic Surgery

## 2020-12-25 DIAGNOSIS — G4700 Insomnia, unspecified: Secondary | ICD-10-CM | POA: Diagnosis not present

## 2020-12-25 DIAGNOSIS — F325 Major depressive disorder, single episode, in full remission: Secondary | ICD-10-CM | POA: Diagnosis not present

## 2020-12-25 DIAGNOSIS — E785 Hyperlipidemia, unspecified: Secondary | ICD-10-CM | POA: Diagnosis not present

## 2020-12-25 DIAGNOSIS — M76822 Posterior tibial tendinitis, left leg: Secondary | ICD-10-CM

## 2020-12-25 MED ORDER — MELOXICAM 15 MG PO TABS: 15.0000 mg | ORAL_TABLET | Freq: Every day | ORAL | 2 refills | Status: AC

## 2020-12-25 NOTE — Progress Notes (Signed)
Office Visit Note   Patient: Erin Shepherd           Date of Birth: 08/29/1951           MRN: [id number] Visit Date: 12/25/2020              Requested by: Harlan Stains, MD [place],   [id number] PCP: Harlan Stains, MD  Chief Complaint  Patient presents with  . Left Foot - Pain      HPI: Patient is a pleasant 70 year old woman who follows up today for her left foot.  She was seen a couple weeks ago and told she had insertional posterior tibial tendinitis.  She has been working aggressively on stretching and has obtained supportive shoe wear but has not worn them today because ordered them over the Internet.  She also got sole orthotics.  Since doing the exercises she has noticed a burning sensation that intermittently comes and goes along the medial aspect of her foot.  She denies any radicular symptoms radiating down her leg.  She already takes gabapentin for restless leg syndrome  Assessment & Plan: Visit Diagnoses: No diagnosis found.  Plan: Will prescribe a course of Mobic and alter her exercises to see if this helps.  Will contact us if she does not improve briefly talked about physical therapy  Follow-Up Instructions: No follow-ups on file.   Ortho Exam  Patient is alert, oriented, no adenopathy, well-dressed, normal affect, normal respiratory effort. Examination demonstrates no swelling no cellulitis she has some pain with resisted internal rotation of her foot.  She does come past neutral with dorsiflexion.  Cannot reproduce neuropathic symptoms now.  Imaging: No results found. No images are attached to the encounter.  Labs: Lab Results  Component Value Date   LABORGA ESCHERICHIA COLI 09/23/2012     Lab Results  Component Value Date   ALBUMIN 4.5 11/08/2019   ALBUMIN 4.4 08/29/2018    No results found for: MG Lab Results  Component Value Date   VD25OH 72.8 08/29/2018   VD25OH 41.3 08/02/2017   VD25OH 48 07/14/2016     No results found for: PREALBUMIN CBC EXTENDED Latest Ref Rng & Units 10/03/2018 08/29/2018 07/04/2014  WBC 3.4 - 10.8 x10E3/uL 5.2 5.3 -  RBC 3.77 - 5.28 x10E6/uL 4.34 4.37 -  HGB 11.1 - 15.9 g/dL 13.4 13.2 13.4  HCT 34.0 - 46.6 % 39.2 39.5 -  PLT 150 - 450 x10E3/uL 228 207 -  NEUTROABS 1.4 - 7.0 x10E3/uL 3.5 3.4 -  LYMPHSABS 0.7 - 3.1 x10E3/uL 1.2 1.0 -     There is no height or weight on file to calculate BMI.  Orders:  No orders of the defined types were placed in this encounter.  Meds ordered this encounter  Medications  . meloxicam (MOBIC) 15 MG tablet    Sig: Take 1 tablet (15 mg total) by mouth daily.    Dispense:  30 tablet    Refill:  2     Procedures: No procedures performed  Clinical Data: No additional findings.  ROS:  All other systems negative, except as noted in the HPI. Review of Systems  Objective: Vital Signs: LMP 09/28/1999   Specialty Comments:  No specialty comments available.  PMFS History: Patient Active Problem List   Diagnosis Date Noted  . Syncope 07/08/2016  . Low serum ferritin level 10/29/2015  . Insomnia due to medical condition 10/29/2015  . RESTLESS LEGS SYNDROME 08/02/2008  . ALLERGIC  RHINITIS 08/01/2008  . INSOMNIA 08/01/2008   Past Medical History:  Diagnosis Date  . Allergic rhinitis   . Arthritis   . Insomnia   . Osteopenia   . Restless leg syndrome   . 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 Squamous cell carcinoma 04/09/2013   left collar bone-about one inch from area removed in 2006    Family History  Problem Relation Age of Onset  . Emphysema Mother   . Asthma Mother   . Heart disease Mother   . Hypertension Mother     Past Surgical History:  Procedure Laterality Date  . ABDOMINAL HYSTERECTOMY  2001  . BUNIONECTOMY    . squamous cell carcinoma removed  05/2005   left collar bone, reexcision 7/14  . squamous cell carcinoma removed Right 02/2019   hand  . tubes in ear     as a child    Social History   Occupational History  . Not on file  Tobacco Use  . Smoking status: Never Smoker  . Smokeless tobacco: Never Used  Vaping Use  . Vaping Use: Never used  Substance and Sexual Activity  . Alcohol use: Yes    Alcohol/week: 3.0 standard drinks    Types: 3 Standard drinks or equivalent per week  . Drug use: No  . Sexual activity: Not Currently    Partners: Male    Birth control/protection: Surgical    Comment: TAH

## 2021-01-26 ENCOUNTER — Other Ambulatory Visit: Payer: Self-pay | Admitting: Adult Health

## 2021-01-26 DIAGNOSIS — G2581 Restless legs syndrome: Secondary | ICD-10-CM

## 2021-02-03 ENCOUNTER — Ambulatory Visit: Payer: PPO

## 2021-03-05 DIAGNOSIS — Z1231 Encounter for screening mammogram for malignant neoplasm of breast: Secondary | ICD-10-CM | POA: Diagnosis not present

## 2021-03-18 DIAGNOSIS — Z23 Encounter for immunization: Secondary | ICD-10-CM | POA: Diagnosis not present

## 2021-03-18 DIAGNOSIS — R030 Elevated blood-pressure reading, without diagnosis of hypertension: Secondary | ICD-10-CM | POA: Diagnosis not present

## 2021-03-18 DIAGNOSIS — E785 Hyperlipidemia, unspecified: Secondary | ICD-10-CM | POA: Diagnosis not present

## 2021-03-20 ENCOUNTER — Encounter (HOSPITAL_BASED_OUTPATIENT_CLINIC_OR_DEPARTMENT_OTHER): Payer: Self-pay | Admitting: Obstetrics & Gynecology

## 2021-03-26 DIAGNOSIS — F325 Major depressive disorder, single episode, in full remission: Secondary | ICD-10-CM | POA: Diagnosis not present

## 2021-03-26 DIAGNOSIS — G4700 Insomnia, unspecified: Secondary | ICD-10-CM | POA: Diagnosis not present

## 2021-03-26 DIAGNOSIS — E785 Hyperlipidemia, unspecified: Secondary | ICD-10-CM | POA: Diagnosis not present

## 2021-04-01 ENCOUNTER — Other Ambulatory Visit: Payer: Self-pay | Admitting: Adult Health

## 2021-04-01 DIAGNOSIS — G2581 Restless legs syndrome: Secondary | ICD-10-CM

## 2021-04-24 ENCOUNTER — Other Ambulatory Visit: Payer: Self-pay | Admitting: Adult Health

## 2021-04-30 ENCOUNTER — Other Ambulatory Visit: Payer: Self-pay | Admitting: Adult Health

## 2021-04-30 DIAGNOSIS — G2581 Restless legs syndrome: Secondary | ICD-10-CM

## 2021-04-30 NOTE — Telephone Encounter (Signed)
Pt has appt 05-06-21

## 2021-05-05 DIAGNOSIS — F325 Major depressive disorder, single episode, in full remission: Secondary | ICD-10-CM | POA: Diagnosis not present

## 2021-05-05 DIAGNOSIS — G4700 Insomnia, unspecified: Secondary | ICD-10-CM | POA: Diagnosis not present

## 2021-05-05 DIAGNOSIS — E785 Hyperlipidemia, unspecified: Secondary | ICD-10-CM | POA: Diagnosis not present

## 2021-05-06 ENCOUNTER — Ambulatory Visit: Payer: PPO | Admitting: Adult Health

## 2021-05-25 ENCOUNTER — Other Ambulatory Visit: Payer: Self-pay | Admitting: Adult Health

## 2021-05-25 DIAGNOSIS — G2581 Restless legs syndrome: Secondary | ICD-10-CM

## 2021-05-26 NOTE — Progress Notes (Signed)
PATIENT: Erin Shepherd DOB: 1951/03/03  REASON FOR VISIT: follow up HISTORY FROM: patient  HISTORY OF PRESENT ILLNESS: Today 05/26/21: Erin Shepherd is a 70 y.o. female being followed for her restless leg syndrome. She is currently taking gabapentin 100 MG PO at 5 pm, 200 MG at 6:30 pm and again at 8 pm. She also takes Sinemet 2 tablets at 5 pm and 1 tablet at 6:30 pm. Denies any issues at this time and reports that her restless legs have been under good control 90% of the time. Patient reports she is sleeping well and enjoys working in the garden   05/05/20: Erin Shepherd is a 70 year old female with a history of restless leg syndrome.  She returns today for follow-up.  She is currently on gabapentin taking 1 tablet at 5 PM and 2 tablets at 630 and again at 8 PM.  She also takes Sinemet 2 tablets at 5 PM and 1 tablet at 630.  She also started on iron supplements.  She states that her restless leg has been under relatively good control.  She returns today for an evaluation.  HISTORY  05/02/19:   Erin Shepherd is a 70 year old female with a history of restless leg syndrome.  She returns today for follow-up.  At the last visit gabapentin was increased to 200 mg 3 times a day.  She reports that she has been taking gabapentin 1 tablet at 515 2 tablets before bedtime.  She states that if she sits in her recliner her symptoms are typically controlled.  But if she tries to recline with her feet up within 20 minutes she begins to have symptoms.  She has not tried increasing her afternoon dose of gabapentin.  She continues to take Sinemet 3-4 times a day.  She returns today for an evaluation.  REVIEW OF SYSTEMS: Out of a complete 14 system review of symptoms, the patient complains only of the following symptoms, and all other reviewed systems are negative.  See HPI  ALLERGIES: Allergies  Allergen Reactions   Sulfonamide Derivatives     HOME MEDICATIONS: Outpatient Medications Prior to Visit  Medication Sig  Dispense Refill   aspirin 81 MG tablet Take 81 mg by mouth. 3 times a week     BIOTIN PO Take by mouth.     CALCIUM PO Take 1,500 mg by mouth daily.     CALCIUM-MAGNESIUM-ZINC PO Take by mouth.     carbidopa-levodopa (SINEMET IR) 25-100 MG tablet TAKE 2 TABLETS BY MOUTH AT 5 PM AND 1 TABLET AT 6:30PM 90 tablet 0   escitalopram (LEXAPRO) 10 MG tablet Take 1 tablet (10 mg total) by mouth daily. 90 tablet 1   fexofenadine (ALLEGRA) 180 MG tablet Take 180 mg by mouth every other day.      fluticasone (FLONASE) 50 MCG/ACT nasal spray Place 2 sprays into both nostrils daily. 16 g 3   gabapentin (NEURONTIN) 100 MG capsule TAKE 1 CAPSULES BY MOUTH AT 5PM, 2 CAPSULES BY MOUTH AT 6:30PM AND 8PM 450 capsule 3   IRON PO Take by mouth daily.     meloxicam (MOBIC) 15 MG tablet Take 1 tablet (15 mg total) by mouth daily. 30 tablet 2   VITAMIN D, ERGOCALCIFEROL, PO Take 1 tablet by mouth daily.     No facility-administered medications prior to visit.    PAST MEDICAL HISTORY: Past Medical History:  Diagnosis Date   Allergic rhinitis    Arthritis    Insomnia    Osteopenia  Restless leg syndrome    Squamous cell carcinoma 9/06   left collar bone   Squamous cell carcinoma 04/09/2013   left collar bone-about one inch from area removed in 2006    PAST SURGICAL HISTORY: Past Surgical History:  Procedure Laterality Date   ABDOMINAL HYSTERECTOMY  2001   BUNIONECTOMY     squamous cell carcinoma removed  05/2005   left collar bone, reexcision 7/14   squamous cell carcinoma removed Right 02/2019   hand   tubes in ear     as a child    FAMILY HISTORY: Family History  Problem Relation Age of Onset   Emphysema Mother    Asthma Mother    Heart disease Mother    Hypertension Mother     SOCIAL HISTORY: Social History   Socioeconomic History   Marital status: Married    Spouse name: Not on file   Number of children: Y   Years of education: Not on file   Highest education level: Not on  file  Occupational History   Not on file  Tobacco Use   Smoking status: Never   Smokeless tobacco: Never  Vaping Use   Vaping Use: Never used  Substance and Sexual Activity   Alcohol use: Yes    Alcohol/week: 3.0 standard drinks    Types: 3 Standard drinks or equivalent per week   Drug use: No   Sexual activity: Not Currently    Partners: Male    Birth control/protection: Surgical    Comment: TAH  Other Topics Concern   Not on file  Social History Narrative   Not on file   Social Determinants of Health   Financial Resource Strain: Not on file  Food Insecurity: Not on file  Transportation Needs: Not on file  Physical Activity: Not on file  Stress: Not on file  Social Connections: Not on file  Intimate Partner Violence: Not on file      PHYSICAL EXAM  Vitals:   05/27/21 1019  BP: (!) 152/85  Pulse: 62  Weight: 159 lb 9.6 oz (72.4 kg)  Height: '5\' 8"'$  (1.727 m)    Body mass index is 24.27 kg/m.  Generalized: Well developed, in no acute distress   Neurological examination  Mentation: Alert oriented to time, place, history taking. Follows all commands speech and language fluent Cranial nerve II-XII: Pupils were equal round reactive to light. Extraocular movements were full, visual field were full on confrontational test. Head turning and shoulder shrug  were normal and symmetric. Motor: The motor testing reveals 5 over 5 strength of all 4 extremities. Good symmetric motor tone is noted throughout.  Sensory: Sensory testing is intact to soft touch on all 4 extremities. No evidence of extinction is noted.  Coordination: Cerebellar testing reveals good finger-nose-finger and heel-to-shin bilaterally.  Gait and station: Gait is normal.  Reflexes: Deep tendon reflexes are symmetric and normal bilaterally.   DIAGNOSTIC DATA (LABS, IMAGING, TESTING) - I reviewed patient records, labs, notes, testing and imaging myself where available.  Lab Results  Component Value  Date   WBC 5.2 10/03/2018   HGB 13.4 10/03/2018   HCT 39.2 10/03/2018   MCV 90 10/03/2018   PLT 228 10/03/2018      Component Value Date/Time   NA 138 11/08/2019 0947   K 4.0 11/08/2019 0947   CL 99 11/08/2019 0947   CO2 27 11/08/2019 0947   GLUCOSE 71 11/08/2019 0947   BUN 16 11/08/2019 0947   CREATININE 0.85 11/08/2019 0947  CALCIUM 9.6 11/08/2019 0947   PROT 6.6 11/08/2019 0947   ALBUMIN 4.5 11/08/2019 0947   AST 31 11/08/2019 0947   ALT 27 11/08/2019 0947   ALKPHOS 96 11/08/2019 0947   BILITOT 0.4 11/08/2019 0947   GFRNONAA 71 11/08/2019 0947   GFRAA 81 11/08/2019 0947   Lab Results  Component Value Date   CHOL 265 (H) 12/08/2020   HDL 74 12/08/2020   LDLCALC 175 (H) 12/08/2020   TRIG 81 12/08/2020   CHOLHDL 3.6 12/08/2020    Lab Results  Component Value Date   TSH 1.320 08/29/2018      ASSESSMENT AND PLAN 70 y.o. year old female  has a past medical history of Allergic rhinitis, Arthritis, Insomnia, Osteopenia, Restless leg syndrome, Squamous cell carcinoma (9/06), and Squamous cell carcinoma (04/09/2013). here with:  Restless leg syndrome  Continue Gabapentin  100 mg at 5PM, 200 mg at 630PM and at bedtime. Continue Sinemet 25-100 mg 2 tablets at 5PM and 1 tablet at 6:30PM Advised if symptoms worsen or she develops new symptoms she should let us know Follow-up in 1 year or sooner if needed (Virtual OK)     Ward Givens, MSN, NP-C 05/26/2021, 3:23 PM [facility] [address], [place], [place] [address] [phone]

## 2021-05-27 ENCOUNTER — Ambulatory Visit: Payer: PPO | Admitting: Adult Health

## 2021-05-27 ENCOUNTER — Encounter: Payer: Self-pay | Admitting: Adult Health

## 2021-05-27 ENCOUNTER — Other Ambulatory Visit: Payer: Self-pay

## 2021-05-27 VITALS — BP 152/85 | HR 62 | Ht 68.0 in | Wt 159.6 lb

## 2021-05-27 DIAGNOSIS — G2581 Restless legs syndrome: Secondary | ICD-10-CM | POA: Diagnosis not present

## 2021-06-02 ENCOUNTER — Telehealth: Payer: Self-pay | Admitting: Adult Health

## 2021-06-02 ENCOUNTER — Other Ambulatory Visit: Payer: Self-pay | Admitting: Adult Health

## 2021-06-02 DIAGNOSIS — G2581 Restless legs syndrome: Secondary | ICD-10-CM

## 2021-06-02 NOTE — Telephone Encounter (Signed)
Pt called requesting refill for gabapentin (NEURONTIN) 100 MG capsule and carbidopa-levodopa (SINEMET IR) 25-100 MG tablet. Pharmacy [facility]. Pt states she is completely out.

## 2021-06-02 NOTE — Telephone Encounter (Addendum)
Gabapentin has been refilled x1 year on 04/27/2021.  We received a refill request for the Sinemet IR today from [facility].  That refill was also sent in x1 year.  The patient is up-to-date on her appointments with one pending for next August 2023.  I called her and let her know the refills should be available now at [facility].  Her questions were answered and she verbalized appreciation for the call.

## 2021-06-16 ENCOUNTER — Other Ambulatory Visit: Payer: Self-pay | Admitting: Neurology

## 2021-06-16 MED ORDER — ESCITALOPRAM OXALATE 10 MG PO TABS: 10.0000 mg | ORAL_TABLET | Freq: Every day | ORAL | 1 refills | Status: DC

## 2021-07-17 DIAGNOSIS — G4700 Insomnia, unspecified: Secondary | ICD-10-CM | POA: Diagnosis not present

## 2021-07-17 DIAGNOSIS — E785 Hyperlipidemia, unspecified: Secondary | ICD-10-CM | POA: Diagnosis not present

## 2021-07-17 DIAGNOSIS — F325 Major depressive disorder, single episode, in full remission: Secondary | ICD-10-CM | POA: Diagnosis not present

## 2021-08-31 DIAGNOSIS — C44622 Squamous cell carcinoma of skin of right upper limb, including shoulder: Secondary | ICD-10-CM | POA: Diagnosis not present

## 2021-08-31 DIAGNOSIS — H61002 Unspecified perichondritis of left external ear: Secondary | ICD-10-CM | POA: Diagnosis not present

## 2021-08-31 DIAGNOSIS — Z85828 Personal history of other malignant neoplasm of skin: Secondary | ICD-10-CM | POA: Diagnosis not present

## 2021-08-31 DIAGNOSIS — D485 Neoplasm of uncertain behavior of skin: Secondary | ICD-10-CM | POA: Diagnosis not present

## 2021-08-31 DIAGNOSIS — D0462 Carcinoma in situ of skin of left upper limb, including shoulder: Secondary | ICD-10-CM | POA: Diagnosis not present

## 2021-08-31 DIAGNOSIS — L570 Actinic keratosis: Secondary | ICD-10-CM | POA: Diagnosis not present

## 2021-09-15 DIAGNOSIS — L821 Other seborrheic keratosis: Secondary | ICD-10-CM | POA: Diagnosis not present

## 2021-09-15 DIAGNOSIS — L570 Actinic keratosis: Secondary | ICD-10-CM | POA: Diagnosis not present

## 2021-09-15 DIAGNOSIS — Z85828 Personal history of other malignant neoplasm of skin: Secondary | ICD-10-CM | POA: Diagnosis not present

## 2021-09-18 ENCOUNTER — Other Ambulatory Visit: Payer: Self-pay | Admitting: Adult Health

## 2021-10-28 DIAGNOSIS — Z23 Encounter for immunization: Secondary | ICD-10-CM | POA: Diagnosis not present

## 2021-10-28 DIAGNOSIS — M8588 Other specified disorders of bone density and structure, other site: Secondary | ICD-10-CM | POA: Diagnosis not present

## 2021-10-28 DIAGNOSIS — Z86010 Personal history of colonic polyps: Secondary | ICD-10-CM | POA: Diagnosis not present

## 2021-10-28 DIAGNOSIS — R030 Elevated blood-pressure reading, without diagnosis of hypertension: Secondary | ICD-10-CM | POA: Diagnosis not present

## 2021-10-28 DIAGNOSIS — E785 Hyperlipidemia, unspecified: Secondary | ICD-10-CM | POA: Diagnosis not present

## 2021-10-28 DIAGNOSIS — G4700 Insomnia, unspecified: Secondary | ICD-10-CM | POA: Diagnosis not present

## 2021-10-28 DIAGNOSIS — Z0000 Encounter for general adult medical examination without abnormal findings: Secondary | ICD-10-CM | POA: Diagnosis not present

## 2021-10-28 DIAGNOSIS — G2581 Restless legs syndrome: Secondary | ICD-10-CM | POA: Diagnosis not present

## 2021-10-28 DIAGNOSIS — E559 Vitamin D deficiency, unspecified: Secondary | ICD-10-CM | POA: Diagnosis not present

## 2021-11-30 DIAGNOSIS — D485 Neoplasm of uncertain behavior of skin: Secondary | ICD-10-CM | POA: Diagnosis not present

## 2021-11-30 DIAGNOSIS — D487 Neoplasm of uncertain behavior of other specified sites: Secondary | ICD-10-CM | POA: Diagnosis not present

## 2021-11-30 DIAGNOSIS — L570 Actinic keratosis: Secondary | ICD-10-CM | POA: Diagnosis not present

## 2021-11-30 DIAGNOSIS — Z85828 Personal history of other malignant neoplasm of skin: Secondary | ICD-10-CM | POA: Diagnosis not present

## 2021-12-14 ENCOUNTER — Other Ambulatory Visit: Payer: Self-pay | Admitting: Adult Health

## 2021-12-16 DIAGNOSIS — Z85828 Personal history of other malignant neoplasm of skin: Secondary | ICD-10-CM | POA: Diagnosis not present

## 2021-12-16 DIAGNOSIS — D0461 Carcinoma in situ of skin of right upper limb, including shoulder: Secondary | ICD-10-CM | POA: Diagnosis not present

## 2021-12-16 DIAGNOSIS — L570 Actinic keratosis: Secondary | ICD-10-CM | POA: Diagnosis not present

## 2021-12-16 DIAGNOSIS — L812 Freckles: Secondary | ICD-10-CM | POA: Diagnosis not present

## 2021-12-16 DIAGNOSIS — D2271 Melanocytic nevi of right lower limb, including hip: Secondary | ICD-10-CM | POA: Diagnosis not present

## 2021-12-16 DIAGNOSIS — L821 Other seborrheic keratosis: Secondary | ICD-10-CM | POA: Diagnosis not present

## 2021-12-16 DIAGNOSIS — D485 Neoplasm of uncertain behavior of skin: Secondary | ICD-10-CM | POA: Diagnosis not present

## 2022-01-13 DIAGNOSIS — C44229 Squamous cell carcinoma of skin of left ear and external auricular canal: Secondary | ICD-10-CM | POA: Diagnosis not present

## 2022-01-13 DIAGNOSIS — Z85828 Personal history of other malignant neoplasm of skin: Secondary | ICD-10-CM | POA: Diagnosis not present

## 2022-01-30 ENCOUNTER — Emergency Department (HOSPITAL_BASED_OUTPATIENT_CLINIC_OR_DEPARTMENT_OTHER): Payer: PPO

## 2022-01-30 ENCOUNTER — Emergency Department (HOSPITAL_BASED_OUTPATIENT_CLINIC_OR_DEPARTMENT_OTHER): Payer: PPO | Admitting: Radiology

## 2022-01-30 ENCOUNTER — Emergency Department (HOSPITAL_BASED_OUTPATIENT_CLINIC_OR_DEPARTMENT_OTHER)
Admission: EM | Admit: 2022-01-30 | Discharge: 2022-01-30 | Disposition: A | Discharge status: Home / Self Care | Payer: PPO | Attending: Emergency Medicine | Admitting: Emergency Medicine

## 2022-01-30 DIAGNOSIS — S52182A Other fracture of upper end of left radius, initial encounter for closed fracture: Secondary | ICD-10-CM

## 2022-01-30 DIAGNOSIS — M79602 Pain in left arm: Secondary | ICD-10-CM | POA: Diagnosis not present

## 2022-01-30 DIAGNOSIS — S52102A Unspecified fracture of upper end of left radius, initial encounter for closed fracture: Secondary | ICD-10-CM | POA: Diagnosis not present

## 2022-01-30 DIAGNOSIS — S6992XA Unspecified injury of left wrist, hand and finger(s), initial encounter: Secondary | ICD-10-CM | POA: Diagnosis present

## 2022-01-30 DIAGNOSIS — Z043 Encounter for examination and observation following other accident: Secondary | ICD-10-CM | POA: Diagnosis not present

## 2022-01-30 DIAGNOSIS — Z85830 Personal history of malignant neoplasm of bone: Secondary | ICD-10-CM | POA: Diagnosis not present

## 2022-01-30 DIAGNOSIS — Y92009 Unspecified place in unspecified non-institutional (private) residence as the place of occurrence of the external cause: Secondary | ICD-10-CM | POA: Diagnosis not present

## 2022-01-30 DIAGNOSIS — W01198A Fall on same level from slipping, tripping and stumbling with subsequent striking against other object, initial encounter: Secondary | ICD-10-CM | POA: Diagnosis not present

## 2022-01-30 DIAGNOSIS — Y9301 Activity, walking, marching and hiking: Secondary | ICD-10-CM | POA: Insufficient documentation

## 2022-01-30 DIAGNOSIS — M25532 Pain in left wrist: Secondary | ICD-10-CM | POA: Insufficient documentation

## 2022-01-30 DIAGNOSIS — M25522 Pain in left elbow: Secondary | ICD-10-CM | POA: Diagnosis not present

## 2022-01-30 NOTE — ED Notes (Signed)
Dc instructions reviewed with patient. Patient voiced understanding. Dc with belongings.  °

## 2022-01-30 NOTE — ED Triage Notes (Signed)
Pt from home c/o left wrist, left arm, left elbow pain s/p fall yesterday landed on her left arm. States she slept good last night with ace bandage on. Pt able to move arm but limited. Denies pain when not moving.  ?

## 2022-01-30 NOTE — Discharge Instructions (Signed)
You were seen in the emerge department today with elbow pain after fall.  You have a small fracture to the bone near the elbow and I am placing you in a splint.  Please follow with your orthopedist.  I have provided their contact information here on the form.  You may take Tylenol and Motrin as needed for pain.  Please keep the splint clean and dry.  Elevate the splint above the level of your heart when at rest then you may apply a cool compress to reduce pain and swelling.  ?

## 2022-01-30 NOTE — ED Provider Notes (Signed)
? ?Emergency Department Provider Note ? ? ?I have reviewed the triage vital signs and the nursing notes. ? ? ?HISTORY ? ?Chief Complaint ?Arm Injury ? ? ?HPI ?Erin Shepherd is a 71 y.o. female presents to the ED with left wrist and arm pain after a fall. She was walking backward at home and tripped over a pot. She fell on her outstretched arm. No head injury. No numbness. No weakness. No neck or back pain.   ? ? ?Past Medical History:  ?Diagnosis Date  ? Allergic rhinitis   ? Arthritis   ? Insomnia   ? Osteopenia   ? Restless leg syndrome   ? Squamous cell carcinoma 9/06  ? left collar bone  ? Squamous cell carcinoma 04/09/2013  ? left collar bone-about one inch from area removed in 2006  ? ? ?Review of Systems ? ?Constitutional: No fever/chills ?Cardiovascular: Denies chest pain. ?Respiratory: Denies shortness of breath. ?Gastrointestinal: No abdominal pain.  ?Genitourinary: Negative for dysuria. ?Musculoskeletal: Positive left arm pain.  ?Skin: Negative for rash. ?Neurological: Negative for headaches. ? ? ?____________________________________________ ? ? ?PHYSICAL EXAM: ? ?VITAL SIGNS: ?ED Triage Vitals  ?Enc Vitals Group  ?   BP 01/30/22 0951 (!) 144/81  ?   Pulse Rate 01/30/22 0951 62  ?   Resp 01/30/22 0951 16  ?   Temp 01/30/22 0951 99.1 ?F (37.3 ?C)  ?   Temp Source 01/30/22 0951 Oral  ?   SpO2 01/30/22 0951 100 %  ?   Weight 01/30/22 0950 160 lb (72.6 kg)  ?   Height 01/30/22 0950 '5\' 8"'$  (1.727 m)  ? ?Constitutional: Alert and oriented. Well appearing and in no acute distress. ?Eyes: Conjunctivae are normal.  ?Head: Atraumatic. ?Nose: No congestion/rhinnorhea. ?Mouth/Throat: Mucous membranes are moist.   ?Neck: No stridor. No cervical spine tenderness to palpation. ?Cardiovascular: Normal rate, regular rhythm. Good peripheral circulation. Grossly normal heart sounds.   ?Respiratory: Normal respiratory effort.  No retractions. Lungs CTAB. ?Gastrointestinal: Soft and nontender. No distention.   ?Musculoskeletal: Tenderness with mild swelling over the left elbow. No laceration.  ?Neurologic:  Normal speech and language.  ?Skin:  Skin is warm, dry and intact. No rash noted. ? ?____________________________________________ ? ?RADIOLOGY ? ?DG Elbow Complete Left ? ?Result Date: 01/30/2022 ?CLINICAL DATA:  71 year old female with LEFT elbow pain following fall yesterday. Initial encounter. EXAM: LEFT ELBOW - COMPLETE 3+ VIEW COMPARISON:  Prior studies FINDINGS: A nondisplaced radial neck fracture is identified with joint effusion. No subluxation or dislocation noted. No other bony abnormalities are noted. IMPRESSION: Nondisplaced radial neck fracture with joint effusion. Electronically Signed   By: Margarette Canada M.D.   On: 01/30/2022 12:33  ? ?DG Forearm Left ? ?Result Date: 01/30/2022 ?CLINICAL DATA:  Status post fall at home yesterday patient limit of her left arm. EXAM: LEFT FOREARM - 2 VIEW COMPARISON:  None Available. FINDINGS: There is no evidence of fracture or other focal bone lesions of the forearm. Soft tissues are unremarkable. Possible very subtle cortical defect at the and the left radial head. IMPRESSION: 1. No acute fracture or dislocation of the left forearm. 2. Possible subtle cortical defect at the left radial head. Recommend dedicated radiographs of the left elbow. Electronically Signed   By: Audie Pinto M.D.   On: 01/30/2022 11:30  ? ?DG Humerus Left ? ?Result Date: 01/30/2022 ?CLINICAL DATA:  Status post fall the left arm. Patient complaining of diffuse left arm pain. EXAM: LEFT HUMERUS - 2+ VIEW COMPARISON:  None  Available. FINDINGS: There is no evidence of fracture or other focal bone lesions. Soft tissues are unremarkable. IMPRESSION: Negative. Electronically Signed   By: Audie Pinto M.D.   On: 01/30/2022 11:23   ? ?____________________________________________ ? ? ?PROCEDURES ? ?Procedure(s) performed:  ? ?Procedures ? ?None ?____________________________________________ ? ? ?INITIAL  IMPRESSION / ASSESSMENT AND PLAN / ED COURSE ? ?Pertinent labs & imaging results that were available during my care of the patient were reviewed by me and considered in my medical decision making (see chart for details). ?  ? ?Radiologic Tests Ordered, included left arm x-rays. I independently interpreted the images and agree with radiology interpretation.  ? ?Medical Decision Making: Summary:  ?Patient presents to the ED with left arm pain and swelling. Fracture suspect clinically and on x-ray. Splint applied. Will follow up with Ortho for follow up.  ? ?Disposition: discharge ? ?____________________________________________ ? ?FINAL CLINICAL IMPRESSION(S) / ED DIAGNOSES ? ?Final diagnoses:  ?Other closed fracture of proximal end of left radius, initial encounter  ? ? ?Note:  This document was prepared using Dragon voice recognition software and may include unintentional dictation errors. ? ?Nanda Quinton, MD, FACEP ?Emergency Medicine ? ?  ?Margette Fast, MD ?02/02/22 [phone] ? ?

## 2022-02-01 DIAGNOSIS — S52102A Unspecified fracture of upper end of left radius, initial encounter for closed fracture: Secondary | ICD-10-CM | POA: Diagnosis not present

## 2022-02-03 ENCOUNTER — Ambulatory Visit (INDEPENDENT_AMBULATORY_CARE_PROVIDER_SITE_OTHER): Payer: PPO | Admitting: Family

## 2022-02-03 DIAGNOSIS — S52135A Nondisplaced fracture of neck of left radius, initial encounter for closed fracture: Secondary | ICD-10-CM | POA: Diagnosis not present

## 2022-02-06 ENCOUNTER — Encounter: Payer: Self-pay | Admitting: Family

## 2022-02-06 NOTE — Progress Notes (Signed)
? ?Office Visit Note ?  ?Patient: Erin Shepherd           ?Date of Birth: 04-18-1951           ?MRN: [id number] ?Visit Date: 02/03/2022 ?             ?Requested by: Harlan Stains, MD ?[place] ?[address] ?[place],  [place] [address] ?PCP: Harlan Stains, MD ? ?Chief Complaint  ?Patient presents with  ? Left Wrist - Pain  ?  F/U Er visit 01/30/2022 s/p fall   ? ? ? ? ?HPI: ?The patient is a 71 year old woman who presents today for initial evaluation of left arm pain.  She fell on an with direct impact on her left elbow on May 5.  She has been in a splint as well as a sling she does have no concerns of bruising has not had significant swelling has had some difficulty sleeping no numbness no tingling no weakness ? ?Assessment & Plan: ?Visit Diagnoses:  ?1. Closed nondisplaced fracture of neck of left radius, initial encounter   ? ? ?Plan: Discussed conservative management of the radial neck fracture.  We will discontinue the splint today.  She will stay in a sling for 1 more week discussed range of motion of the elbow, nonweightbearing of the left upper extremity she will follow-up in 2 weeks with radiographs ? ?Follow-Up Instructions: Return in about 2 weeks (around 02/17/2022).  ? ?Left Elbow Exam  ? ?Tenderness  ?The patient is experiencing tenderness in the medial epicondyle.  ? ?Range of Motion  ?Pronation:  abnormal  ?Supination:  abnormal  ? ?Other  ?Erythema: absent ?Sensation: normal ?Pulse: present ? ? ? ? ?Patient is alert, oriented, no adenopathy, well-dressed, normal affect, normal respiratory effort. ? ? ?Imaging: ?No results found. ?No images are attached to the encounter. ? ?Labs: ?Lab Results  ?Component Value Date  ? [place] 09/23/2012  ? ? ? ?Lab Results  ?Component Value Date  ? ALBUMIN 4.5 11/08/2019  ? ALBUMIN 4.4 08/29/2018  ? ? ?No results found for: MG ?Lab Results  ?Component Value Date  ? VD25OH 72.8 08/29/2018  ? VD25OH 41.3 08/02/2017  ? VD25OH 48 07/14/2016  ? ? ?No  results found for: PREALBUMIN ? ?  Latest Ref Rng & Units 10/03/2018  ?  1:39 PM 08/29/2018  ? 11:30 AM 07/04/2014  ?  3:10 PM  ?CBC EXTENDED  ?WBC 3.4 - 10.8 x10E3/uL 5.2   5.3     ?RBC 3.77 - 5.28 x10E6/uL 4.34   4.37     ?Hemoglobin 11.1 - 15.9 g/dL 13.4   13.2   13.4    ?HCT 34.0 - 46.6 % 39.2   39.5     ?Platelets 150 - 450 x10E3/uL 228   207     ?NEUT# 1.4 - 7.0 x10E3/uL 3.5   3.4     ?Lymph# 0.7 - 3.1 x10E3/uL 1.2   1.0     ? ? ? ?There is no height or weight on file to calculate BMI. ? ?Orders:  ?No orders of the defined types were placed in this encounter. ? ?No orders of the defined types were placed in this encounter. ? ? ? Procedures: ?No procedures performed ? ?Clinical Data: ?No additional findings. ? ?ROS: ? ?All other systems negative, except as noted in the HPI. ?Review of Systems  ?Constitutional:  Negative for chills and fever.  ?Musculoskeletal:  Positive for arthralgias, joint swelling and myalgias.  ?Skin:  Negative for wound.  ?Neurological:  Negative for weakness and numbness.  ? ?Objective: ?Vital Signs: LMP 09/28/1999  ? ?Specialty Comments:  ?No specialty comments available. ? ?PMFS History: ?Patient Active Problem List  ? Diagnosis Date Noted  ? Syncope 07/08/2016  ? Low serum ferritin level 10/29/2015  ? Insomnia due to medical condition 10/29/2015  ? RESTLESS LEGS SYNDROME 08/02/2008  ? ALLERGIC RHINITIS 08/01/2008  ? INSOMNIA 08/01/2008  ? ?Past Medical History:  ?Diagnosis Date  ? Allergic rhinitis   ? Arthritis   ? Insomnia   ? Osteopenia   ? Restless leg syndrome   ? Squamous cell carcinoma 9/06  ? left collar bone  ? Squamous cell carcinoma 04/09/2013  ? left collar bone-about one inch from area removed in 2006  ?  ?Family History  ?Problem Relation Age of Onset  ? Emphysema Mother   ? Asthma Mother   ? Heart disease Mother   ? Hypertension Mother   ? Restless legs syndrome Neg Hx   ?  ?Past Surgical History:  ?Procedure Laterality Date  ? ABDOMINAL HYSTERECTOMY  2001  ? BUNIONECTOMY     ? squamous cell carcinoma removed  05/2005  ? left collar bone, reexcision 7/14  ? squamous cell carcinoma removed Right 02/2019  ? hand  ? tubes in ear    ? as a child  ? ?Social History  ? ?Occupational History  ? Not on file  ?Tobacco Use  ? Smoking status: Never  ? Smokeless tobacco: Never  ?Vaping Use  ? Vaping Use: Never used  ?Substance and Sexual Activity  ? Alcohol use: Not Currently  ?  Alcohol/week: 4.0 standard drinks  ?  Types: 4 Cans of beer per week  ? Drug use: No  ? Sexual activity: Not Currently  ?  Partners: Male  ?  Birth control/protection: Surgical  ?  Comment: TAH  ? ? ? ? ? ?

## 2022-02-09 DIAGNOSIS — Z85828 Personal history of other malignant neoplasm of skin: Secondary | ICD-10-CM | POA: Diagnosis not present

## 2022-02-09 DIAGNOSIS — C44229 Squamous cell carcinoma of skin of left ear and external auricular canal: Secondary | ICD-10-CM | POA: Diagnosis not present

## 2022-02-15 ENCOUNTER — Other Ambulatory Visit: Payer: Self-pay | Admitting: Adult Health

## 2022-02-17 ENCOUNTER — Encounter: Payer: Self-pay | Admitting: Family

## 2022-02-17 ENCOUNTER — Ambulatory Visit (INDEPENDENT_AMBULATORY_CARE_PROVIDER_SITE_OTHER): Payer: PPO | Admitting: Family

## 2022-02-17 ENCOUNTER — Ambulatory Visit (INDEPENDENT_AMBULATORY_CARE_PROVIDER_SITE_OTHER): Payer: PPO

## 2022-02-17 DIAGNOSIS — M25522 Pain in left elbow: Secondary | ICD-10-CM

## 2022-02-17 DIAGNOSIS — S52135A Nondisplaced fracture of neck of left radius, initial encounter for closed fracture: Secondary | ICD-10-CM

## 2022-02-17 NOTE — Progress Notes (Signed)
Office Visit Note   Patient: Erin Shepherd           Date of Birth: [birth year]           MRN: [id number] Visit Date: 02/17/2022              Requested by: Harlan Stains, [place],  [place] [address] PCP: Harlan Stains, MD  Chief Complaint  Patient presents with   Left Elbow - Follow-up    Radial neck fracture       HPI: The patient is a 71 year old woman who presents in follow-up for radial neck fracture of the injury was on 6 May.  She has discontinued her sling and has been nonweightbearing of the left arm however she has been working on range of motion she does not have full range of motion yet has some pain with extension  Assessment & Plan: Visit Diagnoses:  1. Pain in left elbow     Plan: Continue with her home exercise program range of motion of the elbow and wrist.  Reassurance for the pain down her forearm.  We will reevaluate in 4 weeks consider formal therapy at that time  Follow-Up Instructions: No follow-ups on file.   Left Elbow Exam   Tenderness  The patient is experiencing tenderness in the radial head.   Range of Motion  Extension:  abnormal  Flexion:  abnormal  Pronation:  abnormal  Supination:  abnormal   Other  Erythema: absent Sensation: normal Pulse: present  Comments:  Lacks about 20 degrees of full extension     Patient is alert, oriented, no adenopathy, well-dressed, normal affect, normal respiratory effort.   Imaging: No results found. No images are attached to the encounter.  Labs: Lab Results  Component Value Date   LABORGA ESCHERICHIA COLI 09/23/2012     Lab Results  Component Value Date   ALBUMIN 4.5 11/08/2019   ALBUMIN 4.4 08/29/2018    No results found for: MG Lab Results  Component Value Date   VD25OH 72.8 08/29/2018   VD25OH 41.3 08/02/2017   VD25OH 48 07/14/2016    No results found for: PREALBUMIN    Latest Ref Rng & Units 10/03/2018    1:39 PM 08/29/2018   11:30 AM  07/04/2014    3:10 PM  CBC EXTENDED  WBC 3.4 - 10.8 x10E3/uL 5.2   5.3     RBC 3.77 - 5.28 x10E6/uL 4.34   4.37     Hemoglobin 11.1 - 15.9 g/dL 13.4   13.2   13.4    HCT 34.0 - 46.6 % 39.2   39.5     Platelets 150 - 450 x10E3/uL 228   207     NEUT# 1.4 - 7.0 x10E3/uL 3.5   3.4     Lymph# 0.7 - 3.1 x10E3/uL 1.2   1.0        There is no height or weight on file to calculate BMI.  Orders:  Orders Placed This Encounter  Procedures   XR Elbow 2 Views Left   No orders of the defined types were placed in this encounter.    Procedures: No procedures performed  Clinical Data: No additional findings.  ROS:  All other systems negative, except as noted in the HPI. Review of Systems  Objective: Vital Signs: LMP 09/28/1999   Specialty Comments:  No specialty comments available.  PMFS History: Patient Active Problem List   Diagnosis Date Noted   Syncope 07/08/2016  Low serum ferritin level 10/29/2015   Insomnia due to medical condition 10/29/2015   RESTLESS LEGS SYNDROME 08/02/2008   ALLERGIC RHINITIS 08/01/2008   INSOMNIA 08/01/2008   Past Medical History:  Diagnosis Date   Allergic rhinitis    Arthritis    Insomnia    Osteopenia    Restless leg syndrome    Squamous cell carcinoma 9/06   left collar bone   Squamous cell carcinoma 04/09/2013   left collar bone-about one inch from area removed in 2006    Family History  Problem Relation Age of Onset   Emphysema Mother    Asthma Mother    Heart disease Mother    Hypertension Mother    Restless legs syndrome Neg Hx     Past Surgical History:  Procedure Laterality Date   ABDOMINAL HYSTERECTOMY  2001   BUNIONECTOMY     squamous cell carcinoma removed  05/2005   left collar bone, reexcision 7/14   squamous cell carcinoma removed Right 02/2019   hand   tubes in ear     as a child   Social History   Occupational History   Not on file  Tobacco Use   Smoking status: Never   Smokeless tobacco: Never   Vaping Use   Vaping Use: Never used  Substance and Sexual Activity   Alcohol use: Not Currently    Alcohol/week: 4.0 standard drinks    Types: 4 Cans of beer per week   Drug use: No   Sexual activity: Not Currently    Partners: Male    Birth control/protection: Surgical    Comment: TAH

## 2022-02-18 DIAGNOSIS — L723 Sebaceous cyst: Secondary | ICD-10-CM | POA: Diagnosis not present

## 2022-03-10 ENCOUNTER — Other Ambulatory Visit: Payer: Self-pay | Admitting: Adult Health

## 2022-03-11 DIAGNOSIS — Z780 Asymptomatic menopausal state: Secondary | ICD-10-CM | POA: Diagnosis not present

## 2022-03-11 DIAGNOSIS — Z1231 Encounter for screening mammogram for malignant neoplasm of breast: Secondary | ICD-10-CM | POA: Diagnosis not present

## 2022-03-11 DIAGNOSIS — M85852 Other specified disorders of bone density and structure, left thigh: Secondary | ICD-10-CM | POA: Diagnosis not present

## 2022-03-11 DIAGNOSIS — M85851 Other specified disorders of bone density and structure, right thigh: Secondary | ICD-10-CM | POA: Diagnosis not present

## 2022-03-15 ENCOUNTER — Encounter (HOSPITAL_BASED_OUTPATIENT_CLINIC_OR_DEPARTMENT_OTHER): Payer: Self-pay | Admitting: *Deleted

## 2022-03-17 ENCOUNTER — Ambulatory Visit: Payer: PPO | Admitting: Family

## 2022-03-26 ENCOUNTER — Ambulatory Visit (INDEPENDENT_AMBULATORY_CARE_PROVIDER_SITE_OTHER): Payer: PPO | Admitting: Family

## 2022-03-26 ENCOUNTER — Encounter: Payer: Self-pay | Admitting: Family

## 2022-03-26 ENCOUNTER — Ambulatory Visit: Payer: Self-pay

## 2022-03-26 DIAGNOSIS — S52135A Nondisplaced fracture of neck of left radius, initial encounter for closed fracture: Secondary | ICD-10-CM

## 2022-03-26 NOTE — Progress Notes (Signed)
Office Visit Note   Patient: Erin Shepherd           Date of Birth: [birth year]           MRN: [id number] Visit Date: 03/26/2022              Requested by: Harlan Stains, MD [address],  [place] [address] PCP: Harlan Stains, MD  Chief Complaint  Patient presents with   Left Elbow - Fracture, Follow-up      HPI: The patient is a 71 year old woman who is seen today in follow-up.  Radial neck fracture on May 6 she has been able to complete all of her activities of daily living without pain doing quite well full range of motion pleased with her progress.  Did have a little bit of discomfort with prolonged driving holding onto the steering wheel about a week ago.  This has resolved.  Assessment & Plan: Visit Diagnoses:  1. Closed nondisplaced fracture of neck of left radius, initial encounter     Plan: Pleased with her progress.  She may resume activities as tolerated follow-up as needed.  Follow-Up Instructions: Return if symptoms worsen or fail to improve.   Left Elbow Exam   Tenderness  The patient is experiencing tenderness in the radial head.   Range of Motion  The patient has normal left elbow ROM.  Muscle Strength  The patient has normal left elbow strength.  Other  Erythema: absent Sensation: normal Pulse: present      Patient is alert, oriented, no adenopathy, well-dressed, normal affect, normal respiratory effort.   Imaging: No results found. No images are attached to the encounter.  Labs: Lab Results  Component Value Date   LABORGA ESCHERICHIA COLI 09/23/2012     Lab Results  Component Value Date   ALBUMIN 4.5 11/08/2019   ALBUMIN 4.4 08/29/2018    No results found for: "MG" Lab Results  Component Value Date   VD25OH 72.8 08/29/2018   VD25OH 41.3 08/02/2017   VD25OH 48 07/14/2016    No results found for: "PREALBUMIN"    Latest Ref Rng & Units 10/03/2018    1:39 PM 08/29/2018   11:30 AM 07/04/2014    3:10  PM  CBC EXTENDED  WBC 3.4 - 10.8 x10E3/uL 5.2  5.3    RBC 3.77 - 5.28 x10E6/uL 4.34  4.37    Hemoglobin 11.1 - 15.9 g/dL 13.4  13.2  13.4   HCT 34.0 - 46.6 % 39.2  39.5    Platelets 150 - 450 x10E3/uL 228  207    NEUT# 1.4 - 7.0 x10E3/uL 3.5  3.4    Lymph# 0.7 - 3.1 x10E3/uL 1.2  1.0       There is no height or weight on file to calculate BMI.  Orders:  Orders Placed This Encounter  Procedures   XR Elbow 2 Views Left   No orders of the defined types were placed in this encounter.    Procedures: No procedures performed  Clinical Data: No additional findings.  ROS:  All other systems negative, except as noted in the HPI. Review of Systems  Objective: Vital Signs: LMP 09/28/1999   Specialty Comments:  No specialty comments available.  PMFS History: Patient Active Problem List   Diagnosis Date Noted   Syncope 07/08/2016   Low serum ferritin level 10/29/2015   Insomnia due to medical condition 10/29/2015   RESTLESS LEGS SYNDROME 08/02/2008   ALLERGIC RHINITIS 08/01/2008   INSOMNIA  08/01/2008   Past Medical History:  Diagnosis Date   Allergic rhinitis    Arthritis    Insomnia    Osteopenia    Restless leg syndrome    Squamous cell carcinoma 9/06   left collar bone   Squamous cell carcinoma 04/09/2013   left collar bone-about one inch from area removed in 2006    Family History  Problem Relation Age of Onset   Emphysema Mother    Asthma Mother    Heart disease Mother    Hypertension Mother    Restless legs syndrome Neg Hx     Past Surgical History:  Procedure Laterality Date   ABDOMINAL HYSTERECTOMY  2001   BUNIONECTOMY     squamous cell carcinoma removed  05/2005   left collar bone, reexcision 7/14   squamous cell carcinoma removed Right 02/2019   hand   tubes in ear     as a child   Social History   Occupational History   Not on file  Tobacco Use   Smoking status: Never   Smokeless tobacco: Never  Vaping Use   Vaping Use: Never used   Substance and Sexual Activity   Alcohol use: Not Currently    Alcohol/week: 4.0 standard drinks of alcohol    Types: 4 Cans of beer per week   Drug use: No   Sexual activity: Not Currently    Partners: Male    Birth control/protection: Surgical    Comment: TAH

## 2022-05-08 ENCOUNTER — Other Ambulatory Visit: Payer: Self-pay | Admitting: Adult Health

## 2022-05-20 DIAGNOSIS — Z85828 Personal history of other malignant neoplasm of skin: Secondary | ICD-10-CM | POA: Diagnosis not present

## 2022-05-20 DIAGNOSIS — D485 Neoplasm of uncertain behavior of skin: Secondary | ICD-10-CM | POA: Diagnosis not present

## 2022-05-27 ENCOUNTER — Telehealth: Payer: Self-pay | Admitting: Adult Health

## 2022-05-27 ENCOUNTER — Telehealth: Payer: PPO | Admitting: Adult Health

## 2022-05-27 NOTE — Telephone Encounter (Signed)
..   Pt understands that although there may be some limitations with this type of visit, we will take all precautions to reduce any security or privacy concerns.  Pt understands that this will be treated like an in office visit and we will file with pt's insurance, and there may be a patient responsible charge related to this service. ? ?

## 2022-06-02 ENCOUNTER — Telehealth: Payer: PPO | Admitting: Adult Health

## 2022-06-02 DIAGNOSIS — G2581 Restless legs syndrome: Secondary | ICD-10-CM

## 2022-06-02 MED ORDER — GABAPENTIN 100 MG PO CAPS: ORAL_CAPSULE | ORAL | 3 refills | Status: DC

## 2022-06-02 MED ORDER — CARBIDOPA-LEVODOPA 25-100 MG PO TABS: ORAL_TABLET | ORAL | 3 refills | Status: DC

## 2022-06-02 NOTE — Progress Notes (Signed)
PATIENT: Erin Shepherd DOB: 18-Oct-1950  REASON FOR VISIT: follow up HISTORY FROM: patient  Virtual Visit via Video Note  I connected with Tyson Dense on 06/02/22 at  9:30 AM EDT by a video enabled telemedicine application located remotely at [facility] and verified that I am speaking with the correct person using two identifiers who was located at their own home.   I discussed the limitations of evaluation and management by telemedicine and the availability of in person appointments. The patient expressed understanding and agreed to proceed.   PATIENT: Erin Shepherd DOB: 08/07/1951  REASON FOR VISIT: follow up HISTORY FROM: patient  HISTORY OF PRESENT ILLNESS: Today 06/02/22:  [name] is a 71 year old female with a history of restless leg syndrome.  She returns today for virtual visit.  She remains on gabapentin taking 100 mg in the evenings around 5 PM, 200 mg at 630 and another 200 mg at 8 PM.  She also remains on Sinemet 25-100 mg 2 tablets at 5 PM and 1 tablet at 630.  She states that this combination has given her the best benefit. She does try to reduce her sinemet to 1 tablet a day and sometimes that works well. Some days she still needs all three tablets.  She denies any new symptoms.  She returns today for an evaluation.  HISTORY 05/26/21: [name] is a 70 y.o. female being followed for her restless leg syndrome. She is currently taking gabapentin 100 MG PO at 5 pm, 200 MG at 6:30 pm and again at 8 pm. She also takes Sinemet 2 tablets at 5 pm and 1 tablet at 6:30 pm. Denies any issues at this time and reports that her restless legs have been under good control 90% of the time. Patient reports she is sleeping well and enjoys working in the garden  REVIEW OF SYSTEMS: Out of a complete 14 system review of symptoms, the patient complains only of the following symptoms, and all other reviewed systems are negative.  ALLERGIES: Allergies  Allergen  Reactions   Sulfonamide Derivatives     HOME MEDICATIONS: Outpatient Medications Prior to Visit  Medication Sig Dispense Refill   aspirin 81 MG tablet Take 81 mg by mouth. 3 times a week     BIOTIN PO Take by mouth.     CALCIUM PO Take 1,500 mg by mouth daily.     CALCIUM-MAGNESIUM-ZINC PO Take by mouth.     carbidopa-levodopa (SINEMET IR) 25-100 MG tablet TAKE 2 TABLETS BY MOUTH AT 5 PM AND 1 TABLET AT 6:30PM 270 tablet 3   escitalopram (LEXAPRO) 10 MG tablet TAKE 1 TABLET BY MOUTH ONCE A DAY 90 tablet 0   fexofenadine (ALLEGRA) 180 MG tablet Take 180 mg by mouth every other day.      fluticasone (FLONASE) 50 MCG/ACT nasal spray Place 2 sprays into both nostrils daily. 16 g 3   gabapentin (NEURONTIN) 100 MG capsule TAKE 1 CAPSULE BY MOUTH AT 5PM,AND 2 CAPSULES BY MOUTH AT 6:30PM AND 8PM 450 capsule 3   VITAMIN D, ERGOCALCIFEROL, PO Take 1 tablet by mouth daily.     No facility-administered medications prior to visit.    PAST MEDICAL HISTORY: Past Medical History:  Diagnosis Date   Allergic rhinitis    Arthritis    Insomnia    Osteopenia    Restless leg syndrome    Squamous cell carcinoma 9/06   left collar bone   Squamous cell carcinoma 04/09/2013  left collar bone-about one inch from area removed in 2006    PAST SURGICAL HISTORY: Past Surgical History:  Procedure Laterality Date   ABDOMINAL HYSTERECTOMY  2001   BUNIONECTOMY     squamous cell carcinoma removed  05/2005   left collar bone, reexcision 7/14   squamous cell carcinoma removed Right 02/2019   hand   tubes in ear     as a child    FAMILY HISTORY: Family History  Problem Relation Age of Onset   Emphysema Mother    Asthma Mother    Heart disease Mother    Hypertension Mother    Restless legs syndrome Neg Hx     SOCIAL HISTORY: Social History   Socioeconomic History   Marital status: Married    Spouse name: Not on file   Number of children: Y   Years of education: Not on file   Highest  education level: Not on file  Occupational History   Not on file  Tobacco Use   Smoking status: Never   Smokeless tobacco: Never  Vaping Use   Vaping Use: Never used  Substance and Sexual Activity   Alcohol use: Not Currently    Alcohol/week: 4.0 standard drinks of alcohol    Types: 4 Cans of beer per week   Drug use: No   Sexual activity: Not Currently    Partners: Male    Birth control/protection: Surgical    Comment: TAH  Other Topics Concern   Not on file  Social History Narrative   Not on file   Social Determinants of Health   Financial Resource Strain: Not on file  Food Insecurity: Not on file  Transportation Needs: Not on file  Physical Activity: Not on file  Stress: Not on file  Social Connections: Not on file  Intimate Partner Violence: Not on file      PHYSICAL EXAM Generalized: Well developed, in no acute distress   Neurological examination  Mentation: Alert oriented to time, place, history taking. Follows all commands speech and language fluent Cranial nerve II-XII:Extraocular movements were full. Facial symmetry noted. uvula tongue midline. Head turning and shoulder shrug  were normal and symmetric. Motor: Good strength throughout subjectively per patient Sensory: Sensory testing is intact to soft touch on all 4 extremities subjectively per patient Coordination: Cerebellar testing reveals good finger-nose-finger  Gait and station: Patient is able to stand from a seated position. gait is normal.  Reflexes: UTA  DIAGNOSTIC DATA (LABS, IMAGING, TESTING) - I reviewed patient records, labs, notes, testing and imaging myself where available.  Lab Results  Component Value Date   WBC 5.2 10/03/2018   HGB 13.4 10/03/2018   HCT 39.2 10/03/2018   MCV 90 10/03/2018   PLT 228 10/03/2018      Component Value Date/Time   NA 138 11/08/2019 0947   K 4.0 11/08/2019 0947   CL 99 11/08/2019 0947   CO2 27 11/08/2019 0947   GLUCOSE 71 11/08/2019 0947   BUN 16  11/08/2019 0947   CREATININE 0.85 11/08/2019 0947   CALCIUM 9.6 11/08/2019 0947   PROT 6.6 11/08/2019 0947   ALBUMIN 4.5 11/08/2019 0947   AST 31 11/08/2019 0947   ALT 27 11/08/2019 0947   ALKPHOS 96 11/08/2019 0947   BILITOT 0.4 11/08/2019 0947   GFRNONAA 71 11/08/2019 0947   GFRAA 81 11/08/2019 0947   Lab Results  Component Value Date   CHOL 265 (H) 12/08/2020   HDL 74 12/08/2020   LDLCALC 175 (H) 12/08/2020  TRIG 81 12/08/2020   CHOLHDL 3.6 12/08/2020    Lab Results  Component Value Date   TSH 1.320 08/29/2018      ASSESSMENT AND PLAN 71 y.o. year old female  has a past medical history of Allergic rhinitis, Arthritis, Insomnia, Osteopenia, Restless leg syndrome, Squamous cell carcinoma (9/06), and Squamous cell carcinoma (04/09/2013). here with:  1.  Restless leg syndrome  Continue Gabapentin  100 mg at 5PM, 200 mg at 630PM and at bedtime. Continue Sinemet 25-100 mg 2 tablets at 5PM and 1 tablet at 6:30PM Advised if symptoms worsen or she develops new symptoms she should let us know Follow-up in 1 year or sooner if needed     Ward Givens, MSN, NP-C 06/02/2022, 9:26 AM [facility] [address] [place], [place] [address] [phone]

## 2022-06-16 DIAGNOSIS — K5730 Diverticulosis of large intestine without perforation or abscess without bleeding: Secondary | ICD-10-CM | POA: Diagnosis not present

## 2022-06-16 DIAGNOSIS — K649 Unspecified hemorrhoids: Secondary | ICD-10-CM | POA: Diagnosis not present

## 2022-06-16 DIAGNOSIS — D120 Benign neoplasm of cecum: Secondary | ICD-10-CM | POA: Diagnosis not present

## 2022-06-16 DIAGNOSIS — Z09 Encounter for follow-up examination after completed treatment for conditions other than malignant neoplasm: Secondary | ICD-10-CM | POA: Diagnosis not present

## 2022-06-16 DIAGNOSIS — Z86010 Personal history of colonic polyps: Secondary | ICD-10-CM | POA: Diagnosis not present

## 2022-06-18 DIAGNOSIS — D120 Benign neoplasm of cecum: Secondary | ICD-10-CM | POA: Diagnosis not present

## 2022-09-14 ENCOUNTER — Other Ambulatory Visit: Payer: Self-pay | Admitting: Adult Health

## 2022-11-11 DIAGNOSIS — E559 Vitamin D deficiency, unspecified: Secondary | ICD-10-CM | POA: Diagnosis not present

## 2022-11-11 DIAGNOSIS — M8588 Other specified disorders of bone density and structure, other site: Secondary | ICD-10-CM | POA: Diagnosis not present

## 2022-11-11 DIAGNOSIS — R030 Elevated blood-pressure reading, without diagnosis of hypertension: Secondary | ICD-10-CM | POA: Diagnosis not present

## 2022-11-11 DIAGNOSIS — G4700 Insomnia, unspecified: Secondary | ICD-10-CM | POA: Diagnosis not present

## 2022-11-11 DIAGNOSIS — G2581 Restless legs syndrome: Secondary | ICD-10-CM | POA: Diagnosis not present

## 2022-11-11 DIAGNOSIS — Z0000 Encounter for general adult medical examination without abnormal findings: Secondary | ICD-10-CM | POA: Diagnosis not present

## 2022-11-11 DIAGNOSIS — J309 Allergic rhinitis, unspecified: Secondary | ICD-10-CM | POA: Diagnosis not present

## 2022-11-11 DIAGNOSIS — Z9181 History of falling: Secondary | ICD-10-CM | POA: Diagnosis not present

## 2022-11-11 DIAGNOSIS — E785 Hyperlipidemia, unspecified: Secondary | ICD-10-CM | POA: Diagnosis not present

## 2022-11-17 ENCOUNTER — Ambulatory Visit (INDEPENDENT_AMBULATORY_CARE_PROVIDER_SITE_OTHER): Payer: PPO | Admitting: Physician Assistant

## 2022-11-17 ENCOUNTER — Encounter: Payer: Self-pay | Admitting: Physician Assistant

## 2022-11-17 ENCOUNTER — Ambulatory Visit (INDEPENDENT_AMBULATORY_CARE_PROVIDER_SITE_OTHER): Payer: PPO

## 2022-11-17 DIAGNOSIS — M79672 Pain in left foot: Secondary | ICD-10-CM | POA: Diagnosis not present

## 2022-11-17 MED ORDER — MELOXICAM 7.5 MG PO TABS: 7.5000 mg | ORAL_TABLET | Freq: Every day | ORAL | 1 refills | Status: DC

## 2022-11-17 NOTE — Progress Notes (Signed)
Office Visit Note   Patient: Erin Shepherd           Date of Birth: [birth year]           MRN: [id number] Visit Date: 11/17/2022              Requested by: Harlan Stains, MD [address],  [place] [address] PCP: Harlan Stains, MD  Chief Complaint  Patient presents with   Left Heel - Pain      HPI: Patient is a pleasant 72 year old woman who is very active and fit.  She comes in today complaining of left heel pain.  She denies any particular injury but does do daily walking.  She was walking on uneven surfaces with carrying wood.  She went to bed that evening and woke up the next morning and tried to place weight on her foot and had immediate pain in the heel.  She has a previous history of posterior tibial tendinitis.  She has been wearing her supportive sneakers with her inserts.  The pain is worse when she first gets up after sitting or after sleeping.  She does say that after she stretches out it feels better  Assessment & Plan: Visit Diagnoses:  1. Pain of left heel     Plan: Findings consistent with plantar fasciitis.  Given the amount of pain she has she may even have a partial rupture.  I recommended continuing to stay in the shoes which are stiff with good arch supports.  Will call her in meloxicam to take daily instead of Naprosyn or ibuprofen for the next couple weeks.  Have given her stretching exercises to do daily.  We also talked about a night splint since her symptoms seem to be particularly worse after she has been sitting a while or when she first arises in the morning  Pain Assessment  Average Pain: 7 Current Pain: 3 when just sitting Aggravating Factors: Standing up after sitting a while or when she first gets up in the morning Alleviating Factors: Walking for a little bit stretches it out Follow-Up Instructions: Return in about 2 weeks (around 12/01/2022).   Ortho Exam  Patient is alert, oriented, no adenopathy, well-dressed, normal  affect, normal respiratory effort. Examination she has a strong dorsalis pedis pulse she has no swelling no cellulitis no ecchymosis.  She is focally tender over the plantar surface of the heel radiates a little bit to the lateral side.  She is neurovascularly intact.  She has good plantarflexion dorsiflexion eversion and inversion which does not produce reproduce any pain.  Compartments are soft and nontender  Imaging: XR Os Calcis Left  Result Date: 11/17/2022 2 view radiographs of the left heel were taken today.  Demonstrate well-maintained alignment no evidence of fracture she does have a slight Haglund's deformity traction osteophyte off the plantar surface of the heel  No images are attached to the encounter.  Labs: Lab Results  Component Value Date   LABORGA ESCHERICHIA COLI 09/23/2012     Lab Results  Component Value Date   ALBUMIN 4.5 11/08/2019   ALBUMIN 4.4 08/29/2018    No results found for: "MG" Lab Results  Component Value Date   VD25OH 72.8 08/29/2018   VD25OH 41.3 08/02/2017   VD25OH 48 07/14/2016    No results found for: "PREALBUMIN"    Latest Ref Rng & Units 10/03/2018    1:39 PM 08/29/2018   11:30 AM 07/04/2014    3:10 PM  CBC EXTENDED  WBC 3.4 - 10.8 x10E3/uL 5.2  5.3    RBC 3.77 - 5.28 x10E6/uL 4.34  4.37    Hemoglobin 11.1 - 15.9 g/dL 13.4  13.2  13.4   HCT 34.0 - 46.6 % 39.2  39.5    Platelets 150 - 450 x10E3/uL 228  207    NEUT# 1.4 - 7.0 x10E3/uL 3.5  3.4    Lymph# 0.7 - 3.1 x10E3/uL 1.2  1.0       There is no height or weight on file to calculate BMI.  Orders:  Orders Placed This Encounter  Procedures   XR Os Calcis Left   Meds ordered this encounter  Medications   meloxicam (MOBIC) 7.5 MG tablet    Sig: Take 1 tablet (7.5 mg total) by mouth daily.    Dispense:  30 tablet    Refill:  1     Procedures: No procedures performed  Clinical Data: No additional findings.  ROS:  All other systems negative, except as noted in the  HPI. Review of Systems  Objective: Vital Signs: LMP 09/28/1999   Specialty Comments:  No specialty comments available.  PMFS History: Patient Active Problem List   Diagnosis Date Noted   Syncope 07/08/2016   Low serum ferritin level 10/29/2015   Insomnia due to medical condition 10/29/2015   RESTLESS LEGS SYNDROME 08/02/2008   ALLERGIC RHINITIS 08/01/2008   INSOMNIA 08/01/2008   Past Medical History:  Diagnosis Date   Allergic rhinitis    Arthritis    Insomnia    Osteopenia    Restless leg syndrome    Squamous cell carcinoma 9/06   left collar bone   Squamous cell carcinoma 04/09/2013   left collar bone-about one inch from area removed in 2006    Family History  Problem Relation Age of Onset   Emphysema Mother    Asthma Mother    Heart disease Mother    Hypertension Mother    Restless legs syndrome Neg Hx     Past Surgical History:  Procedure Laterality Date   ABDOMINAL HYSTERECTOMY  2001   BUNIONECTOMY     squamous cell carcinoma removed  05/2005   left collar bone, reexcision 7/14   squamous cell carcinoma removed Right 02/2019   hand   tubes in ear     as a child   Social History   Occupational History   Not on file  Tobacco Use   Smoking status: Never   Smokeless tobacco: Never  Vaping Use   Vaping Use: Never used  Substance and Sexual Activity   Alcohol use: Not Currently    Alcohol/week: 4.0 standard drinks of alcohol    Types: 4 Cans of beer per week   Drug use: No   Sexual activity: Not Currently    Partners: Male    Birth control/protection: Surgical    Comment: TAH

## 2022-12-01 ENCOUNTER — Ambulatory Visit: Payer: PPO | Admitting: Physician Assistant

## 2022-12-13 ENCOUNTER — Other Ambulatory Visit: Payer: Self-pay | Admitting: Adult Health

## 2023-01-12 DIAGNOSIS — L812 Freckles: Secondary | ICD-10-CM | POA: Diagnosis not present

## 2023-01-12 DIAGNOSIS — L821 Other seborrheic keratosis: Secondary | ICD-10-CM | POA: Diagnosis not present

## 2023-01-12 DIAGNOSIS — Z85828 Personal history of other malignant neoplasm of skin: Secondary | ICD-10-CM | POA: Diagnosis not present

## 2023-01-12 DIAGNOSIS — D0462 Carcinoma in situ of skin of left upper limb, including shoulder: Secondary | ICD-10-CM | POA: Diagnosis not present

## 2023-01-12 DIAGNOSIS — D485 Neoplasm of uncertain behavior of skin: Secondary | ICD-10-CM | POA: Diagnosis not present

## 2023-01-12 DIAGNOSIS — L570 Actinic keratosis: Secondary | ICD-10-CM | POA: Diagnosis not present

## 2023-01-12 DIAGNOSIS — D225 Melanocytic nevi of trunk: Secondary | ICD-10-CM | POA: Diagnosis not present

## 2023-02-15 DIAGNOSIS — E785 Hyperlipidemia, unspecified: Secondary | ICD-10-CM | POA: Diagnosis not present

## 2023-03-17 DIAGNOSIS — Z1231 Encounter for screening mammogram for malignant neoplasm of breast: Secondary | ICD-10-CM | POA: Diagnosis not present

## 2023-03-23 DIAGNOSIS — F3289 Other specified depressive episodes: Secondary | ICD-10-CM | POA: Diagnosis not present

## 2023-03-23 DIAGNOSIS — G2581 Restless legs syndrome: Secondary | ICD-10-CM | POA: Diagnosis not present

## 2023-03-24 DIAGNOSIS — N6489 Other specified disorders of breast: Secondary | ICD-10-CM | POA: Diagnosis not present

## 2023-03-24 DIAGNOSIS — R922 Inconclusive mammogram: Secondary | ICD-10-CM | POA: Diagnosis not present

## 2023-06-01 DIAGNOSIS — L84 Corns and callosities: Secondary | ICD-10-CM | POA: Diagnosis not present

## 2023-06-01 DIAGNOSIS — Z85828 Personal history of other malignant neoplasm of skin: Secondary | ICD-10-CM | POA: Diagnosis not present

## 2023-06-01 DIAGNOSIS — L821 Other seborrheic keratosis: Secondary | ICD-10-CM | POA: Diagnosis not present

## 2023-06-03 ENCOUNTER — Ambulatory Visit: Payer: PPO | Admitting: Family

## 2023-06-03 ENCOUNTER — Other Ambulatory Visit: Payer: Self-pay

## 2023-06-03 DIAGNOSIS — M25561 Pain in right knee: Secondary | ICD-10-CM | POA: Diagnosis not present

## 2023-06-03 DIAGNOSIS — G8929 Other chronic pain: Secondary | ICD-10-CM | POA: Diagnosis not present

## 2023-06-03 NOTE — Progress Notes (Signed)
Office Visit Note   Patient: Erin Shepherd           Date of Birth: August 13, 1951           MRN: [id number] Visit Date: 06/03/2023              Requested by: Laurann Montana, MD [phone] [place],  [place] [address] PCP: Laurann Montana, MD  Chief Complaint  Patient presents with   Left Knee - Pain      HPI: The patient is a 71 year old woman who presents complaining of right knee pain this has been ongoing now for about a year and a half.  She initially she does have an injury she can recall about a year and a half ago she was kneeling to paint some baseboards when she felt something when she twisted her right knee and the medial joint line.  The pain gradually eased off over weeks to months.  She denies any associated swelling she does have popping and locking.  She states about 2 months ago the pain returned she does not recall a subsequent injury she often walks about 15,000 steps a day this is been difficult due to the medial knee pain  Assessment & Plan: Visit Diagnoses:  1. Chronic pain of right knee     Plan: Suspect possible meniscal injury she does have moderate osteoarthritis.  At this point she would like to continue with conservative measures anti-inflammatory strengthening she will continue with her home exercise program she will call if she would like to consider Depo-Medrol injections or MRI of the knee  Follow-Up Instructions: No follow-ups on file.   Right Knee Exam   Muscle Strength  The patient has normal right knee strength.  Tenderness  The patient is experiencing tenderness in the medial joint line.  Range of Motion  The patient has normal right knee ROM.  Tests  Varus: negative Valgus: negative  Other  Erythema: absent Effusion: no effusion present      Patient is alert, oriented, no adenopathy, well-dressed, normal affect, normal respiratory effort.   Imaging: No results found. No images are attached to the  encounter.  Labs: Lab Results  Component Value Date   LABORGA ESCHERICHIA COLI 09/23/2012     Lab Results  Component Value Date   ALBUMIN 4.5 11/08/2019   ALBUMIN 4.4 08/29/2018    No results found for: "MG" Lab Results  Component Value Date   VD25OH 72.8 08/29/2018   VD25OH 41.3 08/02/2017   VD25OH 48 07/14/2016    No results found for: "PREALBUMIN"    Latest Ref Rng & Units 10/03/2018    1:39 PM 08/29/2018   11:30 AM 07/04/2014    3:10 PM  CBC EXTENDED  WBC 3.4 - 10.8 x10E3/uL 5.2  5.3    RBC 3.77 - 5.28 x10E6/uL 4.34  4.37    Hemoglobin 11.1 - 15.9 g/dL 62.9  52.8  41.3   HCT 34.0 - 46.6 % 39.2  39.5    Platelets 150 - 450 x10E3/uL 228  207    NEUT# 1.4 - 7.0 x10E3/uL 3.5  3.4    Lymph# 0.7 - 3.1 x10E3/uL 1.2  1.0       There is no height or weight on file to calculate BMI.  Orders:  Orders Placed This Encounter  Procedures   XR Knee 1-2 Views Right   No orders of the defined types were placed in this encounter.    Procedures: No procedures  performed  Clinical Data: No additional findings.  ROS:  All other systems negative, except as noted in the HPI. Review of Systems  Objective: Vital Signs: LMP 09/28/1999   Specialty Comments:  No specialty comments available.  PMFS History: Patient Active Problem List   Diagnosis Date Noted   Syncope 07/08/2016   Low serum ferritin level 10/29/2015   Insomnia due to medical condition 10/29/2015   RESTLESS LEGS SYNDROME 08/02/2008   ALLERGIC RHINITIS 08/01/2008   INSOMNIA 08/01/2008   Past Medical History:  Diagnosis Date   Allergic rhinitis    Arthritis    Insomnia    Osteopenia    Restless leg syndrome    Squamous cell carcinoma 9/06   left collar bone   Squamous cell carcinoma 04/09/2013   left collar bone-about one inch from area removed in 2006    Family History  Problem Relation Age of Onset   Emphysema Mother    Asthma Mother    Heart disease Mother    Hypertension Mother     Restless legs syndrome Neg Hx     Past Surgical History:  Procedure Laterality Date   ABDOMINAL HYSTERECTOMY  2001   BUNIONECTOMY     squamous cell carcinoma removed  05/2005   left collar bone, reexcision 7/14   squamous cell carcinoma removed Right 02/2019   hand   tubes in ear     as a child   Social History   Occupational History   Not on file  Tobacco Use   Smoking status: Never   Smokeless tobacco: Never  Vaping Use   Vaping status: Never Used  Substance and Sexual Activity   Alcohol use: Not Currently    Alcohol/week: 4.0 standard drinks of alcohol    Types: 4 Cans of beer per week   Drug use: No   Sexual activity: Not Currently    Partners: Male    Birth control/protection: Surgical    Comment: TAH

## 2023-06-07 ENCOUNTER — Other Ambulatory Visit: Payer: Self-pay | Admitting: Adult Health

## 2023-06-07 DIAGNOSIS — G2581 Restless legs syndrome: Secondary | ICD-10-CM

## 2023-06-09 NOTE — Telephone Encounter (Signed)
Last seen on 05/27/22 Follow up scheduled on 06/10/23

## 2023-06-10 ENCOUNTER — Telehealth (INDEPENDENT_AMBULATORY_CARE_PROVIDER_SITE_OTHER): Payer: PPO | Admitting: Adult Health

## 2023-06-10 DIAGNOSIS — G2581 Restless legs syndrome: Secondary | ICD-10-CM

## 2023-06-10 NOTE — Progress Notes (Signed)
PATIENT: Erin Shepherd DOB: 04-23-1951  REASON FOR VISIT: follow up HISTORY FROM: patient  Virtual Visit via Video Note  I connected with Osborne Casco on 06/10/23 at 11:45 AM EDT by a video enabled telemedicine application located remotely at [facility] and verified that I am speaking with the correct person using two identifiers who was located at their own home.   I discussed the limitations of evaluation and management by telemedicine and the availability of in person appointments. The patient expressed understanding and agreed to proceed.   PATIENT: Erin Shepherd DOB: [birth year]  REASON FOR VISIT: follow up HISTORY FROM: patient  HISTORY OF PRESENT ILLNESS: Today 06/10/23:  [name] is a 72 y.o. female with a history of RLS. Returns today for follow-up.  She remains on gabapentin, and Sinemet.  She takes gabapentin 100 mg at 5 PM, 200 mg at 6:30 PM and another 200 mg at 8 PM.  She takes Sinemet 25-100 mg 2 tablets at 5 PM and 1 tablet at 6:30 PM.  This continues to work well for her.  Occasionally she will have breakthrough symptoms.  There is often times that she tries to reduce her dose of medication.  06/02/22: [name] is a 72 year old female with a history of restless leg syndrome.  She returns today for virtual visit.  She remains on gabapentin taking 100 mg in the evenings around 5 PM, 200 mg at 630 and another 200 mg at 8 PM.  She also remains on Sinemet 25-100 mg 2 tablets at 5 PM and 1 tablet at 630.  She states that this combination has given her the best benefit. She does try to reduce her sinemet to 1 tablet a day and sometimes that works well. Some days she still needs all three tablets.  She denies any new symptoms.  She returns today for an evaluation.  HISTORY 05/26/21: Ms Erin Shepherd is a 72 y.o. female being followed for her restless leg syndrome. She is currently taking gabapentin 100 MG PO at 5 pm, 200 MG at 6:30 pm and again at 8 pm. She  also takes Sinemet 2 tablets at 5 pm and 1 tablet at 6:30 pm. Denies any issues at this time and reports that her restless legs have been under good control 90% of the time. Patient reports she is sleeping well and enjoys working in the garden  REVIEW OF SYSTEMS: Out of a complete 14 system review of symptoms, the patient complains only of the following symptoms, and all other reviewed systems are negative.  ALLERGIES: Allergies  Allergen Reactions   Sulfonamide Derivatives     HOME MEDICATIONS: Outpatient Medications Prior to Visit  Medication Sig Dispense Refill   aspirin 81 MG tablet Take 81 mg by mouth. 3 times a week     BIOTIN PO Take by mouth.     CALCIUM PO Take 1,500 mg by mouth daily.     CALCIUM-MAGNESIUM-ZINC PO Take by mouth.     carbidopa-levodopa (SINEMET IR) 25-100 MG tablet TAKE TWO TABLETS BY MOUTH AT FIVE IN THE EVENING AND ONE TABLET AT 6:30 IN THE EVENING 270 tablet 0   escitalopram (LEXAPRO) 10 MG tablet TAKE ONE TABLET BY MOUTH ONCE A DAY 90 tablet 1   fexofenadine (ALLEGRA) 180 MG tablet Take 180 mg by mouth every other day.      fluticasone (FLONASE) 50 MCG/ACT nasal spray Place 2 sprays into both nostrils daily. 16 g 3  gabapentin (NEURONTIN) 100 MG capsule TAKE ONE CAPSULE BY MOUTH AT 5PM,AND TWO CAPSULES BY MOUTH AT 6:30PM AND 8PM 450 capsule 0   meloxicam (MOBIC) 7.5 MG tablet Take 1 tablet (7.5 mg total) by mouth daily. 30 tablet 1   VITAMIN D, ERGOCALCIFEROL, PO Take 1 tablet by mouth daily.     No facility-administered medications prior to visit.    PAST MEDICAL HISTORY: Past Medical History:  Diagnosis Date   Allergic rhinitis    Arthritis    Insomnia    Osteopenia    Restless leg syndrome    Squamous cell carcinoma 9/06   left collar bone   Squamous cell carcinoma 04/09/2013   left collar bone-about one inch from area removed in 2006    PAST SURGICAL HISTORY: Past Surgical History:  Procedure Laterality Date   ABDOMINAL HYSTERECTOMY   2001   BUNIONECTOMY     squamous cell carcinoma removed  05/2005   left collar bone, reexcision 7/14   squamous cell carcinoma removed Right 02/2019   hand   tubes in ear     as a child    FAMILY HISTORY: Family History  Problem Relation Age of Onset   Emphysema Mother    Asthma Mother    Heart disease Mother    Hypertension Mother    Restless legs syndrome Neg Hx     SOCIAL HISTORY: Social History   Socioeconomic History   Marital status: Married    Spouse name: Not on file   Number of children: Y   Years of education: Not on file   Highest education level: Not on file  Occupational History   Not on file  Tobacco Use   Smoking status: Never   Smokeless tobacco: Never  Vaping Use   Vaping status: Never Used  Substance and Sexual Activity   Alcohol use: Not Currently    Alcohol/week: 4.0 standard drinks of alcohol    Types: 4 Cans of beer per week   Drug use: No   Sexual activity: Not Currently    Partners: Male    Birth control/protection: Surgical    Comment: TAH  Other Topics Concern   Not on file  Social History Narrative   Not on file   Social Determinants of Health   Financial Resource Strain: Not on file  Food Insecurity: Not on file  Transportation Needs: Not on file  Physical Activity: Not on file  Stress: Not on file  Social Connections: Not on file  Intimate Partner Violence: Not on file      PHYSICAL EXAM Generalized: Well developed, in no acute distress   Neurological examination  Mentation: Alert oriented to time, place, history taking. Follows all commands speech and language fluent Cranial nerve II-XII:Extraocular movements were full. Facial symmetry noted. uvula tongue midline. Head turning and shoulder shrug  were normal and symmetric. Motor: Good strength throughout subjectively per patient Sensory: Sensory testing is intact to soft touch on all 4 extremities subjectively per patient Coordination: Cerebellar testing reveals good  finger-nose-finger  Gait and station: Patient is able to stand from a seated position. gait is normal.  Reflexes: UTA  DIAGNOSTIC DATA (LABS, IMAGING, TESTING) - I reviewed patient records, labs, notes, testing and imaging myself where available.  Lab Results  Component Value Date   WBC 5.2 10/03/2018   HGB 13.4 10/03/2018   HCT 39.2 10/03/2018   MCV 90 10/03/2018   PLT 228 10/03/2018      Component Value Date/Time   NA  138 11/08/2019 0947   K 4.0 11/08/2019 0947   CL 99 11/08/2019 0947   CO2 27 11/08/2019 0947   GLUCOSE 71 11/08/2019 0947   BUN 16 11/08/2019 0947   CREATININE 0.85 11/08/2019 0947   CALCIUM 9.6 11/08/2019 0947   PROT 6.6 11/08/2019 0947   ALBUMIN 4.5 11/08/2019 0947   AST 31 11/08/2019 0947   ALT 27 11/08/2019 0947   ALKPHOS 96 11/08/2019 0947   BILITOT 0.4 11/08/2019 0947   GFRNONAA 71 11/08/2019 0947   GFRAA 81 11/08/2019 0947   Lab Results  Component Value Date   CHOL 265 (H) 12/08/2020   HDL 74 12/08/2020   LDLCALC 175 (H) 12/08/2020   TRIG 81 12/08/2020   CHOLHDL 3.6 12/08/2020    Lab Results  Component Value Date   TSH 1.320 08/29/2018      ASSESSMENT AND PLAN 72 y.o. year old female  has a past medical history of Allergic rhinitis, Arthritis, Insomnia, Osteopenia, Restless leg syndrome, Squamous cell carcinoma (9/06), and Squamous cell carcinoma (04/09/2013). here with:  1.  Restless leg syndrome  Continue Gabapentin  100 mg at 5PM, 200 mg at 630PM and at bedtime. Continue Sinemet 25-100 mg 2 tablets at 5PM and 1 tablet at 6:30PM Advised if symptoms worsen or she develops new symptoms she should let us know Follow-up in 1 year or sooner if needed     Butch Penny, MSN, NP-C 06/10/2023, 11:24 AM [facility] [address], [address] [place], [place] [address] [phone]

## 2023-09-22 ENCOUNTER — Other Ambulatory Visit: Payer: Self-pay | Admitting: Adult Health

## 2023-09-22 ENCOUNTER — Telehealth: Payer: Self-pay | Admitting: Adult Health

## 2023-09-22 NOTE — Telephone Encounter (Signed)
Duplicate encounter

## 2023-09-22 NOTE — Telephone Encounter (Signed)
Rx refilled for Lexapro and Gabapentin.

## 2023-09-22 NOTE — Telephone Encounter (Signed)
Pt request for refills for escitalopram (LEXAPRO) 10 MG tablet ( out of this medication) and gabapentin (NEURONTIN) 100 MG capsule. Need refills done today due to going of town for 8 days. Please send to  [facility]

## 2023-10-18 ENCOUNTER — Other Ambulatory Visit: Payer: Self-pay | Admitting: Adult Health

## 2023-10-18 DIAGNOSIS — G2581 Restless legs syndrome: Secondary | ICD-10-CM

## 2023-10-18 NOTE — Telephone Encounter (Signed)
Rx refilled per last appointment note, "Continue Sinemet 25-100 mg 2 tablets at 5PM and 1 tablet at 6:30PM."

## 2023-10-24 ENCOUNTER — Telehealth: Payer: Self-pay | Admitting: Adult Health

## 2023-10-24 DIAGNOSIS — G2581 Restless legs syndrome: Secondary | ICD-10-CM

## 2023-10-24 MED ORDER — ESCITALOPRAM OXALATE 10 MG PO TABS: 10.0000 mg | ORAL_TABLET | Freq: Every day | ORAL | 3 refills | Status: DC

## 2023-10-24 MED ORDER — CARBIDOPA-LEVODOPA 25-100 MG PO TABS: ORAL_TABLET | ORAL | 3 refills | Status: DC

## 2023-10-24 MED ORDER — GABAPENTIN 100 MG PO CAPS: ORAL_CAPSULE | ORAL | 3 refills | Status: DC

## 2023-10-24 NOTE — Telephone Encounter (Signed)
Rx refilled per last office visit note.  "Continue Gabapentin  100 mg at 5PM, 200 mg at 630PM and at bedtime. Continue Sinemet 25-100 mg 2 tablets at 5PM and 1 tablet at 6:30PM"  Lexapro filled by work in [place] on 09/22/23.

## 2023-10-24 NOTE — Telephone Encounter (Signed)
Pt is asking that her medications:carbidopa-levodopa (SINEMET IR) 25-100 MG tablet gabapentin (NEURONTIN) 100 MG capsule & escitalopram (LEXAPRO) 10 MG tablet have a 1 yr refill to [facility]

## 2023-12-07 ENCOUNTER — Telehealth: Payer: Self-pay | Admitting: Family

## 2023-12-07 DIAGNOSIS — G8929 Other chronic pain: Secondary | ICD-10-CM

## 2023-12-07 NOTE — Telephone Encounter (Signed)
 Pt was last in the office in 05/2023. If this too far out to order MRI?

## 2023-12-07 NOTE — Telephone Encounter (Signed)
 Pt called requesting a MRI right inside knee. Pt phone number is [phone].

## 2023-12-28 DIAGNOSIS — E785 Hyperlipidemia, unspecified: Secondary | ICD-10-CM | POA: Diagnosis not present

## 2023-12-28 DIAGNOSIS — E2839 Other primary ovarian failure: Secondary | ICD-10-CM | POA: Diagnosis not present

## 2024-01-02 DIAGNOSIS — Z1331 Encounter for screening for depression: Secondary | ICD-10-CM | POA: Diagnosis not present

## 2024-01-02 DIAGNOSIS — Z008 Encounter for other general examination: Secondary | ICD-10-CM | POA: Diagnosis not present

## 2024-01-02 DIAGNOSIS — M25562 Pain in left knee: Secondary | ICD-10-CM | POA: Diagnosis not present

## 2024-01-02 DIAGNOSIS — F3289 Other specified depressive episodes: Secondary | ICD-10-CM | POA: Diagnosis not present

## 2024-01-02 DIAGNOSIS — Z1339 Encounter for screening examination for other mental health and behavioral disorders: Secondary | ICD-10-CM | POA: Diagnosis not present

## 2024-01-02 DIAGNOSIS — R82998 Other abnormal findings in urine: Secondary | ICD-10-CM | POA: Diagnosis not present

## 2024-01-02 DIAGNOSIS — E782 Mixed hyperlipidemia: Secondary | ICD-10-CM | POA: Diagnosis not present

## 2024-01-02 DIAGNOSIS — G2581 Restless legs syndrome: Secondary | ICD-10-CM | POA: Diagnosis not present

## 2024-01-03 DIAGNOSIS — L812 Freckles: Secondary | ICD-10-CM | POA: Diagnosis not present

## 2024-01-03 DIAGNOSIS — L570 Actinic keratosis: Secondary | ICD-10-CM | POA: Diagnosis not present

## 2024-01-03 DIAGNOSIS — L84 Corns and callosities: Secondary | ICD-10-CM | POA: Diagnosis not present

## 2024-01-03 DIAGNOSIS — L218 Other seborrheic dermatitis: Secondary | ICD-10-CM | POA: Diagnosis not present

## 2024-01-03 DIAGNOSIS — L818 Other specified disorders of pigmentation: Secondary | ICD-10-CM | POA: Diagnosis not present

## 2024-01-03 DIAGNOSIS — Z85828 Personal history of other malignant neoplasm of skin: Secondary | ICD-10-CM | POA: Diagnosis not present

## 2024-01-03 DIAGNOSIS — L821 Other seborrheic keratosis: Secondary | ICD-10-CM | POA: Diagnosis not present

## 2024-01-03 DIAGNOSIS — L858 Other specified epidermal thickening: Secondary | ICD-10-CM | POA: Diagnosis not present

## 2024-01-11 ENCOUNTER — Ambulatory Visit
Admission: RE | Admit: 2024-01-11 | Discharge: 2024-01-11 | Disposition: A | Discharge status: Home / Self Care | Source: Ambulatory Visit | Attending: Family | Admitting: Family

## 2024-01-11 DIAGNOSIS — M25461 Effusion, right knee: Secondary | ICD-10-CM | POA: Diagnosis not present

## 2024-01-11 DIAGNOSIS — M1711 Unilateral primary osteoarthritis, right knee: Secondary | ICD-10-CM | POA: Diagnosis not present

## 2024-01-11 DIAGNOSIS — S83231A Complex tear of medial meniscus, current injury, right knee, initial encounter: Secondary | ICD-10-CM | POA: Diagnosis not present

## 2024-01-11 DIAGNOSIS — G8929 Other chronic pain: Secondary | ICD-10-CM

## 2024-01-20 NOTE — Progress Notes (Signed)
Will you offer mri review with duda

## 2024-01-30 ENCOUNTER — Ambulatory Visit: Admitting: Orthopedic Surgery

## 2024-01-30 DIAGNOSIS — G8929 Other chronic pain: Secondary | ICD-10-CM

## 2024-01-30 DIAGNOSIS — M25561 Pain in right knee: Secondary | ICD-10-CM

## 2024-02-05 ENCOUNTER — Encounter: Payer: Self-pay | Admitting: Orthopedic Surgery

## 2024-02-05 NOTE — Progress Notes (Signed)
 Office Visit Note   Patient: Erin Shepherd           Date of Birth: 08-12-1951           MRN: [id number] Visit Date: 01/30/2024              Requested by: Victorio Grave, MD [phone] [place],  [place] [address] PCP: Victorio Grave, MD  Chief Complaint  Patient presents with   Right Knee - Follow-up    MRI review       HPI: Patient is a 73 year old woman with medial right knee pain status post MRI scan.  Assessment & Plan: Visit Diagnoses:  1. Chronic pain of right knee     Plan: Discussed the possibility of arthroscopic debridement of the meniscal tear.  Risk and benefits were discussed including persistent pain need for additional surgery.  Discussed the patient would still have pain from arthritis but would not have pain from the meniscal tear.  Patient states that she will call us  if she wants to proceed with arthroscopic debridement of the medial meniscal tear.  Follow-Up Instructions: Return if symptoms worsen or fail to improve.   Ortho Exam  Patient is alert, oriented, no adenopathy, well-dressed, normal affect, normal respiratory effort. Examination patient does have a antalgic gait.  She has full range of motion of the right knee and no mechanical symptoms.  She is tender to palpation over the medial joint line.  Collaterals and cruciates are stable.  Review of the MRI scan does show tricompartmental arthritis as well as a tear of the posterior horn of the medial meniscus.  There is no displaced flap tear present  Imaging: No results found. No images are attached to the encounter.  Labs: Lab Results  Component Value Date   LABORGA ESCHERICHIA COLI 09/23/2012     Lab Results  Component Value Date   ALBUMIN 4.5 11/08/2019   ALBUMIN 4.4 08/29/2018    No results found for: "MG" Lab Results  Component Value Date   VD25OH 72.8 08/29/2018   VD25OH 41.3 08/02/2017   VD25OH 48 07/14/2016    No results found for: "PREALBUMIN"    Latest  Ref Rng & Units 10/03/2018    1:39 PM 08/29/2018   11:30 AM 07/04/2014    3:10 PM  CBC EXTENDED  WBC 3.4 - 10.8 x10E3/uL 5.2  5.3    RBC 3.77 - 5.28 x10E6/uL 4.34  4.37    Hemoglobin 11.1 - 15.9 g/dL 59.5  63.8  75.6   HCT 34.0 - 46.6 % 39.2  39.5    Platelets 150 - 450 x10E3/uL 228  207    NEUT# 1.4 - 7.0 x10E3/uL 3.5  3.4    Lymph# 0.7 - 3.1 x10E3/uL 1.2  1.0       There is no height or weight on file to calculate BMI.  Orders:  No orders of the defined types were placed in this encounter.  No orders of the defined types were placed in this encounter.    Procedures: No procedures performed  Clinical Data: No additional findings.  ROS:  All other systems negative, except as noted in the HPI. Review of Systems  Objective: Vital Signs: LMP 09/28/1999   Specialty Comments:  No specialty comments available.  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   Squamous cell carcinoma 04/09/2013   left collar bone-about one inch from area removed in 2006    Family History  Problem Relation Age of Onset   Emphysema Mother    Asthma Mother    Heart disease Mother    Hypertension Mother    Restless legs syndrome Neg Hx     Past Surgical History:  Procedure Laterality Date   ABDOMINAL HYSTERECTOMY  2001   BUNIONECTOMY     squamous cell carcinoma removed  05/2005   left collar bone, reexcision 7/14   squamous cell carcinoma removed Right 02/2019   hand   tubes in ear     as a child   Social History   Occupational History   Not on file  Tobacco Use   Smoking status:  Never   Smokeless tobacco: Never  Vaping Use   Vaping status: Never Used  Substance and Sexual Activity   Alcohol use: Not Currently    Alcohol/week: 4.0 standard drinks of alcohol    Types: 4 Cans of beer per week   Drug use: No   Sexual activity: Not Currently    Partners: Male    Birth control/protection: Surgical    Comment: TAH

## 2024-02-06 ENCOUNTER — Telehealth: Payer: Self-pay | Admitting: Orthopedic Surgery

## 2024-02-06 NOTE — Telephone Encounter (Signed)
 Sheet given to [facility] for scheduling.

## 2024-02-06 NOTE — Telephone Encounter (Signed)
 Patient called. She would like to go ahead and do the surgery.

## 2024-03-07 ENCOUNTER — Telehealth: Payer: Self-pay | Admitting: Orthopedic Surgery

## 2024-03-07 NOTE — Telephone Encounter (Signed)
 I spoke with Erin Shepherd and confirmed that surgery is scheduled for 03/16/24.   She should hear from the hospital next week with her other instructions.

## 2024-03-07 NOTE — Telephone Encounter (Signed)
 Pt states she's checking the status of the surgery that she was told is scheduled for 03/16/24.

## 2024-03-12 NOTE — H&P (Addendum)
 Erin Shepherd is an 73 y.o. female.   Chief Complaint: right knee pain HPI: The patient is a 73 year old woman who presents complaining of right knee pain this has been ongoing now for about a year and a half. She initially did  have an injury she can recall about a year and a half ago she was kneeling to paint some baseboards when she felt something when she felt like she twisted her right knee and the medial joint line started to have pain. The pain gradually eased off over weeks to months. She denies any associated swelling she does have popping and locking.   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 Is a moderate reactive joint effusion and Baker's cyst.   There is a complex tear of the posterior horn and body medial meniscus. No displaced meniscal flap is present.   She is now scheduled for Knee arthroscopy with DR. Harden.   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 She has never used smokeless tobacco. She reports that she does not currently use alcohol  after a past usage of about 4.0 standard drinks of alcohol  per week. She reports that she does not use  drugs.  Allergies:  Allergies  Allergen Reactions   Sulfonamide Derivatives     No medications prior to admission.    No results found for this or any previous visit (from the past 48 hours). No results found.  Review of Systems  All other systems reviewed and are negative.   Last menstrual period 09/28/1999.  Patient is alert, oriented, no adenopathy, well-dressed, normal affect, normal respiratory effort. Examination patient does have a antalgic gait.  She has full range of motion of the right knee and no mechanical symptoms.  She is tender to palpation over the medial joint line.  Collaterals and cruciates are stable.  Right Knee Exam    Muscle Strength  The patient has normal right knee strength.   Tenderness  The patient is experiencing tenderness in the medial joint line.   Range of Motion  The patient has normal right knee ROM.   Tests  Varus: negative Valgus: negative   Other  Erythema: absent Effusion: no effusion present  Assessment/Plan Plan for arthroscopic debridement of the meniscal tear. Risk and benefits were discussed including persistent pain need for additional surgery. Discussed the patient would still have pain from arthritis but would not have pain from the meniscal tear. Patient states that she will call us  if she wants to  proceed with arthroscopic debridement of the medial meniscal tear.   Maurilio Deland Collet, PA-C 03/12/2024, 12:54 PM

## 2024-03-15 ENCOUNTER — Encounter (HOSPITAL_COMMUNITY): Payer: Self-pay | Admitting: Orthopedic Surgery

## 2024-03-15 ENCOUNTER — Other Ambulatory Visit: Payer: Self-pay

## 2024-03-15 NOTE — Progress Notes (Signed)
 SDW call  Patient was given pre-op instructions over the phone. Patient verbalized understanding of instructions provided.     PCP - Dr. Suan Elm Cardiologist -  Pulmonary:    PPM/ICD - denies Device Orders - na Rep Notified - na   Chest x-ray - na EKG -  na Stress Test - ECHO - 2009 Cardiac Cath -   Sleep Study/sleep apnea/CPAP: denies  Non-diabetic  Blood Thinner Instructions: denies Aspirin Instructions:denies   ERAS Protcol - Clears until 0430  Anesthesia review: No   Patient denies shortness of breath, fever, cough and chest pain over the phone call  Your procedure is scheduled on Friday March 16, 2024  Report to [facility] Main Entrance A at 0530  A.M., then check in with the Admitting office.  Call this number if you have problems the morning of surgery:  [phone]   If you have any questions prior to your surgery date call [phone]: Open Monday-Friday 8am-4pm If you experience any cold or flu symptoms such as cough, fever, chills, shortness of breath, etc. between now and your scheduled surgery, please notify us  at the above number    Remember:  Do not eat after midnight the night before your surgery  You may drink clear liquids until  0430   the morning of your surgery.   Clear liquids allowed are: Water, Non-Citrus Juices (without pulp), Carbonated Beverages, Clear Tea, Black Coffee ONLY (NO MILK, CREAM OR POWDERED CREAMER of any kind), and Gatorade   Take these medicines if needed the morning of surgery:  Flonase   As of today, STOP taking any Aspirin (unless otherwise instructed by your surgeon) Aleve, Naproxen, Ibuprofen, Motrin, Advil, Goody's, BC's, all herbal medications, fish oil, and all vitamins.

## 2024-03-15 NOTE — Anesthesia Preprocedure Evaluation (Addendum)
 Anesthesia Evaluation  Patient identified by MRN, date of birth, ID band Patient awake    Reviewed: Allergy & Precautions, NPO status , Patient's Chart, lab work & pertinent test results  Airway Mallampati: II  TM Distance: >3 FB Neck ROM: Full    Dental no notable dental hx. (+) Teeth Intact, Dental Advisory Given   Pulmonary neg pulmonary ROS   Pulmonary exam normal breath sounds clear to auscultation       Cardiovascular negative cardio ROS Normal cardiovascular exam Rhythm:Regular Rate:Normal     Neuro/Psych Restless leg syndrome negative neurological ROS  negative psych ROS   GI/Hepatic negative GI ROS, Neg liver ROS,,,  Endo/Other  negative endocrine ROS    Renal/GU negative Renal ROS  negative genitourinary   Musculoskeletal negative musculoskeletal ROS (+) Arthritis ,    Abdominal   Peds  Hematology negative hematology ROS (+)   Anesthesia Other Findings   Reproductive/Obstetrics                             Anesthesia Physical Anesthesia Plan  ASA: 2  Anesthesia Plan: General   Post-op Pain Management: Tylenol PO (pre-op)*   Induction: Intravenous  PONV Risk Score and Plan: 3 and Ondansetron, Dexamethasone and Treatment may vary due to age or medical condition  Airway Management Planned: LMA  Additional Equipment:   Intra-op Plan:   Post-operative Plan: Extubation in OR  Informed Consent: I have reviewed the patients History and Physical, chart, labs and discussed the procedure including the risks, benefits and alternatives for the proposed anesthesia with the patient or authorized representative who has indicated his/her understanding and acceptance.     Dental advisory given  Plan Discussed with: CRNA  Anesthesia Plan Comments:        Anesthesia Quick Evaluation

## 2024-03-16 ENCOUNTER — Ambulatory Visit (HOSPITAL_COMMUNITY)
Admission: RE | Admit: 2024-03-16 | Discharge: 2024-03-16 | Disposition: A | Discharge status: Home / Self Care | Source: Ambulatory Visit | Attending: Orthopedic Surgery | Admitting: Orthopedic Surgery

## 2024-03-16 ENCOUNTER — Other Ambulatory Visit (HOSPITAL_COMMUNITY): Payer: Self-pay

## 2024-03-16 ENCOUNTER — Other Ambulatory Visit: Payer: Self-pay

## 2024-03-16 ENCOUNTER — Ambulatory Visit (HOSPITAL_COMMUNITY): Payer: Self-pay | Admitting: Anesthesiology

## 2024-03-16 ENCOUNTER — Encounter (HOSPITAL_COMMUNITY)
Admission: RE | Disposition: A | Discharge status: Home / Self Care | Payer: Self-pay | Source: Ambulatory Visit | Attending: Orthopedic Surgery

## 2024-03-16 DIAGNOSIS — X58XXXA Exposure to other specified factors, initial encounter: Secondary | ICD-10-CM | POA: Diagnosis not present

## 2024-03-16 DIAGNOSIS — Z9889 Other specified postprocedural states: Secondary | ICD-10-CM

## 2024-03-16 DIAGNOSIS — G2581 Restless legs syndrome: Secondary | ICD-10-CM | POA: Diagnosis not present

## 2024-03-16 DIAGNOSIS — S83241A Other tear of medial meniscus, current injury, right knee, initial encounter: Secondary | ICD-10-CM | POA: Insufficient documentation

## 2024-03-16 DIAGNOSIS — M7121 Synovial cyst of popliteal space [Baker], right knee: Secondary | ICD-10-CM | POA: Diagnosis not present

## 2024-03-16 DIAGNOSIS — M958 Other specified acquired deformities of musculoskeletal system: Secondary | ICD-10-CM

## 2024-03-16 DIAGNOSIS — M23203 Derangement of unspecified medial meniscus due to old tear or injury, right knee: Secondary | ICD-10-CM

## 2024-03-16 DIAGNOSIS — G8929 Other chronic pain: Secondary | ICD-10-CM

## 2024-03-16 DIAGNOSIS — Z419 Encounter for procedure for purposes other than remedying health state, unspecified: Secondary | ICD-10-CM

## 2024-03-16 DIAGNOSIS — S83241D Other tear of medial meniscus, current injury, right knee, subsequent encounter: Secondary | ICD-10-CM | POA: Diagnosis present

## 2024-03-16 DIAGNOSIS — M1711 Unilateral primary osteoarthritis, right knee: Secondary | ICD-10-CM | POA: Insufficient documentation

## 2024-03-16 HISTORY — PX: KNEE ARTHROSCOPY WITH MEDIAL MENISECTOMY: SHX5651

## 2024-03-16 LAB — CBC
HCT: 41.7 % (ref 36.0–46.0)
Hemoglobin: 13.7 g/dL (ref 12.0–15.0)
MCH: 30.9 pg (ref 26.0–34.0)
MCHC: 32.9 g/dL (ref 30.0–36.0)
MCV: 93.9 fL (ref 80.0–100.0)
Platelets: 193 10*3/uL (ref 150–400)
RBC: 4.44 MIL/uL (ref 3.87–5.11)
RDW: 11.9 % (ref 11.5–15.5)
WBC: 5.2 10*3/uL (ref 4.0–10.5)
nRBC: 0 % (ref 0.0–0.2)

## 2024-03-16 SURGERY — ARTHROSCOPY, KNEE, WITH MEDIAL MENISCECTOMY
Anesthesia: General | Site: Knee | Laterality: Right

## 2024-03-16 MED ORDER — CHLORHEXIDINE GLUCONATE 0.12 % MT SOLN
15.0000 mL | Freq: Once | OROMUCOSAL | Status: AC
  Administered 2024-03-16: 15 mL via OROMUCOSAL
  Filled 2024-03-16: qty 15

## 2024-03-16 MED ORDER — OXYCODONE HCL 5 MG PO TABS
5.0000 mg | ORAL_TABLET | Freq: Once | ORAL | Status: AC | PRN
  Administered 2024-03-16: 5 mg via ORAL

## 2024-03-16 MED ORDER — OXYCODONE-ACETAMINOPHEN 5-325 MG PO TABS
1.0000 | ORAL_TABLET | Freq: Four times a day (QID) | ORAL | 0 refills | Status: AC | PRN
  Filled 2024-03-16: qty 30, 7d supply, fill #0

## 2024-03-16 MED ORDER — ORAL CARE MOUTH RINSE: 15.0000 mL | Freq: Once | OROMUCOSAL | Status: AC

## 2024-03-16 MED ORDER — FENTANYL CITRATE (PF) 250 MCG/5ML IJ SOLN
INTRAMUSCULAR | Status: DC | PRN
  Administered 2024-03-16: 50 ug via INTRAVENOUS

## 2024-03-16 MED ORDER — ACETAMINOPHEN 500 MG PO TABS
1000.0000 mg | ORAL_TABLET | Freq: Once | ORAL | Status: AC
  Administered 2024-03-16: 1000 mg via ORAL
  Filled 2024-03-16: qty 2

## 2024-03-16 MED ORDER — OXYCODONE HCL 5 MG PO TABS
ORAL_TABLET | ORAL | Status: AC
  Filled 2024-03-16: qty 1

## 2024-03-16 MED ORDER — AMISULPRIDE (ANTIEMETIC) 5 MG/2ML IV SOLN: 10.0000 mg | Freq: Once | INTRAVENOUS | Status: DC | PRN

## 2024-03-16 MED ORDER — PROPOFOL 10 MG/ML IV BOLUS
INTRAVENOUS | Status: AC
  Filled 2024-03-16: qty 20

## 2024-03-16 MED ORDER — FENTANYL CITRATE (PF) 250 MCG/5ML IJ SOLN
INTRAMUSCULAR | Status: AC
  Filled 2024-03-16: qty 5

## 2024-03-16 MED ORDER — HYDROMORPHONE HCL 1 MG/ML IJ SOLN
INTRAMUSCULAR | Status: DC | PRN
  Administered 2024-03-16 (×2): .25 mg via INTRAVENOUS

## 2024-03-16 MED ORDER — MIDAZOLAM HCL 2 MG/2ML IJ SOLN
INTRAMUSCULAR | Status: DC | PRN
  Administered 2024-03-16: 1 mg via INTRAVENOUS

## 2024-03-16 MED ORDER — LIDOCAINE 2% (20 MG/ML) 5 ML SYRINGE
INTRAMUSCULAR | Status: DC | PRN
  Administered 2024-03-16: 60 mg via INTRAVENOUS

## 2024-03-16 MED ORDER — SODIUM CHLORIDE 0.9 % IR SOLN
Status: DC | PRN
  Administered 2024-03-16 (×3): 3000 mL

## 2024-03-16 MED ORDER — EPHEDRINE SULFATE-NACL 50-0.9 MG/10ML-% IV SOSY
PREFILLED_SYRINGE | INTRAVENOUS | Status: DC | PRN
  Administered 2024-03-16: 10 mg via INTRAVENOUS
  Administered 2024-03-16: 5 mg via INTRAVENOUS

## 2024-03-16 MED ORDER — FENTANYL CITRATE (PF) 100 MCG/2ML IJ SOLN: 25.0000 ug | INTRAMUSCULAR | Status: DC | PRN

## 2024-03-16 MED ORDER — CEFAZOLIN SODIUM-DEXTROSE 2-4 GM/100ML-% IV SOLN
2.0000 g | INTRAVENOUS | Status: AC
  Administered 2024-03-16: 2 g via INTRAVENOUS
  Filled 2024-03-16: qty 100

## 2024-03-16 MED ORDER — DEXAMETHASONE SODIUM PHOSPHATE 10 MG/ML IJ SOLN
INTRAMUSCULAR | Status: DC | PRN
  Administered 2024-03-16: 10 mg via INTRAVENOUS

## 2024-03-16 MED ORDER — HYDROMORPHONE HCL 1 MG/ML IJ SOLN
INTRAMUSCULAR | Status: AC
  Filled 2024-03-16: qty 0.5

## 2024-03-16 MED ORDER — OXYCODONE HCL 5 MG/5ML PO SOLN: 5.0000 mg | Freq: Once | ORAL | Status: AC | PRN

## 2024-03-16 MED ORDER — LACTATED RINGERS IV SOLN: INTRAVENOUS | Status: DC

## 2024-03-16 MED ORDER — LACTATED RINGERS IV SOLN: INTRAVENOUS | Status: DC | PRN

## 2024-03-16 MED ORDER — ONDANSETRON HCL 4 MG/2ML IJ SOLN
INTRAMUSCULAR | Status: DC | PRN
  Administered 2024-03-16: 4 mg via INTRAVENOUS

## 2024-03-16 MED ORDER — MIDAZOLAM HCL 2 MG/2ML IJ SOLN
INTRAMUSCULAR | Status: AC
Start: 2024-03-16 — End: 2024-03-16
  Filled 2024-03-16: qty 2

## 2024-03-16 MED ORDER — PROPOFOL 10 MG/ML IV BOLUS
INTRAVENOUS | Status: DC | PRN
  Administered 2024-03-16: 130 mg via INTRAVENOUS

## 2024-03-16 SURGICAL SUPPLY — 28 items
BLADE EXCALIBUR 4.0X13 (MISCELLANEOUS) IMPLANT
BNDG COHESIVE 6X5 TAN ST LF (GAUZE/BANDAGES/DRESSINGS) ×2 IMPLANT
BNDG GAUZE DERMACEA FLUFF 4 (GAUZE/BANDAGES/DRESSINGS) ×2 IMPLANT
COVER SURGICAL LIGHT HANDLE (MISCELLANEOUS) ×4 IMPLANT
CUFF TOURN SGL QUICK 42 (TOURNIQUET CUFF) IMPLANT
CUFF TRNQT CYL 34X4.125X (TOURNIQUET CUFF) IMPLANT
DRAPE ARTHROSCOPY W/POUCH 114 (DRAPES) ×2 IMPLANT
DRAPE U-SHAPE 47X51 STRL (DRAPES) ×2 IMPLANT
DRSG EMULSION OIL 3X3 NADH (GAUZE/BANDAGES/DRESSINGS) ×2 IMPLANT
DURAPREP 26ML APPLICATOR (WOUND CARE) ×2 IMPLANT
GAUZE SPONGE 4X4 12PLY STRL (GAUZE/BANDAGES/DRESSINGS) ×2 IMPLANT
GLOVE BIOGEL PI IND STRL 9 (GLOVE) ×2 IMPLANT
GLOVE SURG ORTHO 9.0 STRL STRW (GLOVE) ×2 IMPLANT
GOWN STRL REUS W/ TWL XL LVL3 (GOWN DISPOSABLE) ×6 IMPLANT
KIT BASIN OR (CUSTOM PROCEDURE TRAY) ×2 IMPLANT
KIT TURNOVER KIT B (KITS) ×2 IMPLANT
MANIFOLD NEPTUNE II (INSTRUMENTS) ×2 IMPLANT
NDL 18GX1X1/2 (RX/OR ONLY) (NEEDLE) ×2 IMPLANT
NEEDLE 18GX1X1/2 (RX/OR ONLY) (NEEDLE) ×1 IMPLANT
PACK ARTHROSCOPY DSU (CUSTOM PROCEDURE TRAY) ×2 IMPLANT
PAD ARMBOARD POSITIONER FOAM (MISCELLANEOUS) ×4 IMPLANT
PORT APPOLLO RF 90DEGREE MULTI (SURGICAL WAND) IMPLANT
SUT ETHILON 2 0 FS 18 (SUTURE) IMPLANT
SUT ETHILON 4 0 PS 2 18 (SUTURE) ×2 IMPLANT
TOWEL GREEN STERILE FF (TOWEL DISPOSABLE) ×4 IMPLANT
TUBE CONNECTING 12X1/4 (SUCTIONS) ×2 IMPLANT
TUBING ARTHROSCOPY IRRIG 16FT (MISCELLANEOUS) ×2 IMPLANT
WAND APOLLORF SJ50 AR-9845 (SURGICAL WAND) IMPLANT

## 2024-03-16 NOTE — Discharge Instructions (Signed)
 Increase activity as tolerates.  Walk with crutches for support and balance until you are safe.  Remove the dressing in 3 days.  Shower as needed.  Ice as needed for swelling/pain.  Work on bending you knee gradually.    Surgery to See or Treat a Knee Problem (Knee Arthroscopy): What to Know After After knee arthroscopy, it is common to have swelling and pain. You may also have a small amount of fluid coming from the cuts that were made on your knee. Follow these instructions at home: Medicines Take your medicines only as told. You may need to take steps to help treat or prevent trouble pooping (constipation), such as: Taking medicines to help you poop. Eating foods high in fiber, like beans, whole grains, and fresh fruits and vegetables. Drinking more fluids as told. Ask your health care provider if it's safe to drive or use machines while taking your medicine. If you have a brace: Wear it as told. Take it off only if your provider says you can. Check the skin around it every day. Tell your provider if you see problems. Loosen it if your toes tingle, are numb, or turn cold and blue. Keep the brace clean and dry. Bathing Do not take baths, swim, or use a hot tub until you're told it's OK. If your bandage isn't waterproof: Do not let it get wet. Cover it when you take a bath or shower. Use a cover that doesn't let any water in. Incision care  Take care of your cuts from surgery as told. Make sure you: Wash your hands with soap and water for at least 20 seconds before and after you change your bandage. If you can't use soap and water, use hand sanitizer. Change your bandage. Leave stitches or skin glue alone. Leave tape strips alone unless you're told to take them off. You may trim the edges of the tape strips if they curl up. Check the area around your cuts every day for signs of infection. Check for: Redness. More swelling or pain More fluid or blood. Warmth. Pus or a bad  smell. Managing pain, stiffness, and swelling  Use ice or an ice pack as told. If you have a brace that you can take off, remove it only as told. Put ice in a plastic bag or use the cold flow pad or cold therapy unit that you were given. Place a towel between your skin and the bag or device. Leave the ice on for 20 minutes, 2-3 times a day. If your skin turns red, take off the ice right away to prevent skin damage. The risk of damage is higher if you can't feel pain, heat, or cold. Move your toes often to reduce stiffness and swelling. Raise your leg above the level of your heart while you are sitting or lying down. Use several pillows to keep your leg straight. Do not put a pillow just under the knee. If the knee is bent for a long time, this may lead to stiffness. Activity Rest as told. Get up to take short walks at least every 2 hours during the day. This helps you breathe better and keeps your blood flowing. Ask for help if you feel weak or unsteady. Do not stand or walk on your injured knee until you're told it's OK. Use crutches or a walker. Exercise as told. Avoid high-impact activities, such as running, jumping rope, and doing jumping jacks. Ask what things are safe for you to do at home. Ask  when you can go back to work or school. General instructions Ask your provider when it is safe to drive. Do not smoke, vape, or use nicotine or tobacco. Wear compression stockings to reduce swelling and prevent blood clots in your legs. Keep all follow-up visits. Your provider will need to check how your knee is healing. Contact a health care provider if: You have any signs of infection. You have very bad pain, and medicine does not help. Your cut from surgery breaks open. You have redness, swelling, pain, or warmth in your calf or at the back of your knee. You have numbness or tingling in your lower leg or your foot. Get help right away if: You have trouble breathing or shortness of  breath. You have a fast and irregular heartbeat. You have chest pain. These symptoms may be an emergency. Call 911 right away. Do not wait to see if the symptoms will go away. Do not drive yourself to the hospital. This information is not intended to replace advice given to you by your health care provider. Make sure you discuss any questions you have with your health care provider.

## 2024-03-16 NOTE — Interval H&P Note (Signed)
 History and Physical Interval Note:  03/16/2024 6:38 AM  Erin Shepherd  has presented today for surgery, with the diagnosis of Medial Meniscal Tear Right Knee.  The various methods of treatment have been discussed with the patient and family. After consideration of risks, benefits and other options for treatment, the patient has consented to  Procedure(s) with comments: ARTHROSCOPY, KNEE, WITH MEDIAL MENISCECTOMY (Right) - RIGHT KNEE ARTHROSCOPY AND DEBRIDEMENT as a surgical intervention.  The patient's history has been reviewed, patient examined, no change in status, stable for surgery.  I have reviewed the patient's chart and labs.  Questions were answered to the patient's satisfaction.     Izel Eisenhardt V Genean Adamski

## 2024-03-16 NOTE — Progress Notes (Signed)
 Orthopedic Tech Progress Note Patient Details:  Erin Shepherd 02-Mar-1951 [id number]  Ortho Devices Type of Ortho Device: Crutches Ortho Device/Splint Interventions: Ordered, Adjustment, Application   Post Interventions Patient Tolerated: Well Instructions Provided: Poper ambulation with device  Azaiah Mello A Raschelle Wisenbaker 03/16/2024, 10:09 AM

## 2024-03-16 NOTE — Transfer of Care (Signed)
 Immediate Anesthesia Transfer of Care Note  Patient: Erin Shepherd  Procedure(s) Performed: RIGHT KNEE ARTHROSCOPY WITH MEDIAL MENISCECTOMY (Right: Knee)  Patient Location: PACU  Anesthesia Type:General  Level of Consciousness: awake, alert , and oriented  Airway & Oxygen Therapy: Patient Spontanous Breathing and Patient connected to nasal cannula oxygen  Post-op Assessment: Report given to RN and Post -op Vital signs reviewed and stable  Post vital signs: Reviewed and stable  Last Vitals:  Vitals Value Taken Time  BP 126/65 03/16/24 08:30  Temp 36.4 C 03/16/24 08:21  Pulse 73 03/16/24 08:32  Resp 14 03/16/24 08:32  SpO2 94 % 03/16/24 08:32  Vitals shown include unfiled device data.  Last Pain:  Vitals:   03/16/24 0821  TempSrc:   PainSc: 0-No pain         Complications: No notable events documented.

## 2024-03-16 NOTE — Anesthesia Postprocedure Evaluation (Signed)
 Anesthesia Post Note  Patient: Erin Shepherd  Procedure(s) Performed: RIGHT KNEE ARTHROSCOPY WITH MEDIAL MENISCECTOMY (Right: Knee)     Patient location during evaluation: PACU Anesthesia Type: General Level of consciousness: awake and alert Pain management: pain level controlled Vital Signs Assessment: post-procedure vital signs reviewed and stable Respiratory status: spontaneous breathing, nonlabored ventilation, respiratory function stable and patient connected to nasal cannula oxygen Cardiovascular status: blood pressure returned to baseline and stable Postop Assessment: no apparent nausea or vomiting Anesthetic complications: no  There were no known notable events for this encounter.  Last Vitals:  Vitals:   03/16/24 0900 03/16/24 0910  BP: 128/64 (!) 146/63  Pulse: (!) 53 (!) 54  Resp: 11 12  Temp:  (!) 36.4 C  SpO2: 98% 95%    Last Pain:  Vitals:   03/16/24 0910  TempSrc:   PainSc: 3                  Tait Balistreri L Antoria Lanza

## 2024-03-16 NOTE — Op Note (Signed)
 03/16/2024  8:11 AM  PATIENT:  Erin Shepherd    PRE-OPERATIVE DIAGNOSIS:  Medial Meniscal Tear Right Knee  POST-OPERATIVE DIAGNOSIS: Medial meniscal tear right knee with large osteochondral defect of the medial femoral condyle and plica.  PROCEDURE:  RIGHT KNEE ARTHROSCOPY WITH MEDIAL MENISCECTOMY and abrasion chondroplasty of the medial femoral condyle with excision of medial plica  SURGEON:  Timothy Ford, MD  PHYSICIAN ASSISTANT:None ANESTHESIA:   General  PREOPERATIVE INDICATIONS:  [name] is a  73 y.o. female with a diagnosis of Medial Meniscal Tear Right Knee who failed conservative measures and elected for surgical management.    The risks benefits and alternatives were discussed with the patient preoperatively including but not limited to the risks of infection, bleeding, nerve injury, cardiopulmonary complications, the need for revision surgery, among others, and the patient was willing to proceed.  OPERATIVE IMPLANTS:   * No implants in log *  @ENCIMAGES @  OPERATIVE FINDINGS: Patient had large osteochondral defect involving almost the entire medial femoral condyle.  There was a large complex tear of the medial meniscus.  Patient had a plica medially.  OPERATIVE PROCEDURE: Patient brought the operating room underwent a general anesthetic.  After adequate levels anesthesia obtained patient's right lower extremity was prepped using DuraPrep draped into a sterile field a timeout was called.  The scope was inserted through the anterior lateral portal and anterior medial working portal established for the instruments.  Visualization patient had significant synovitis this was debrided with the shaver and the electrical wand for hemostasis.  Visualization patient had large osteochondral defect medial femoral condyle.  Patient underwent abrasion chondroplasty of the medial femoral condyle debriding this back to bleeding viable subchondral bone.  There was a complex tear of the  posterior medial meniscus.  This was debrided with a shaver and the electrical wand.  Hemostasis was further obtained with the electrical wand.  Examination the lateral joint line in the figure-of-four position showed intact articular cartilage as well as minimal fraying of the lateral meniscus.  Examination of the patellofemoral joint with the knee extended showed a large plica that was excised there was synovitis that was excised and electrical wand was used for hemostasis.  A survey of all compartments showed there to be no loose bodies.  Instruments removed the portals closed using 2-0 nylon a sterile dressing was applied patient was extubated taken the PACU in stable condition.   DISCHARGE PLANNING:  Antibiotic duration: Preoperative antibiotics  Weightbearing: Weightbearing as tolerated  Pain medication: Prescription for Percocet  Dressing care/ Wound VAC: Dry dressing removed in 3 days  Ambulatory devices: Crutches  Discharge to: Home.  Follow-up: In the office 1 week post operative.

## 2024-03-16 NOTE — Anesthesia Procedure Notes (Signed)
 Procedure Name: LMA Insertion Date/Time: 03/16/2024 7:38 AM  Performed by: Loreda Rodriguez, CRNAPre-anesthesia Checklist: Patient identified, Emergency Drugs available, Suction available and Patient being monitored Patient Re-evaluated:Patient Re-evaluated prior to induction Oxygen Delivery Method: Circle System Utilized Preoxygenation: Pre-oxygenation with 100% oxygen Induction Type: IV induction Ventilation: Mask ventilation without difficulty LMA: LMA inserted LMA Size: 4.0 Number of attempts: 1 Airway Equipment and Method: Bite block Placement Confirmation: positive ETCO2 Tube secured with: Tape Dental Injury: Teeth and Oropharynx as per pre-operative assessment

## 2024-03-17 ENCOUNTER — Encounter (HOSPITAL_COMMUNITY): Payer: Self-pay | Admitting: Orthopedic Surgery

## 2024-03-21 ENCOUNTER — Telehealth: Payer: Self-pay

## 2024-03-21 NOTE — Telephone Encounter (Signed)
 The patient called the triage line this morning for advice on her post op knee (had arthroscopy on 03/16/24 with Dr. Harden): her knee is still swollen, but no redness and no warmth. One suture did come out, but there was no bleeding nor drainage. She did take a shower, letting warm and soapy water run over that area, but she made sure it was dry before placing a bandaid over it. Still no drainage. The patient did say she may not be elevating the leg nor icing as much as she should. I advised that she be more diligent with this, and it should improve the swelling. Her post op appt with Dr. Harden is on 03/29/24. The patient said she felt like she did not need to come in, she just needed some assurance that the swelling was not abnormal. I advised that this was normal; however, if the knee does develop any redness, warmth, drainage, she should definitely let us  know. Her first post op appointment with Dr. Harden is on 03/29/24.

## 2024-03-27 DIAGNOSIS — Z1231 Encounter for screening mammogram for malignant neoplasm of breast: Secondary | ICD-10-CM | POA: Diagnosis not present

## 2024-03-29 ENCOUNTER — Ambulatory Visit (INDEPENDENT_AMBULATORY_CARE_PROVIDER_SITE_OTHER): Admitting: Orthopedic Surgery

## 2024-03-29 DIAGNOSIS — M25561 Pain in right knee: Secondary | ICD-10-CM

## 2024-03-29 DIAGNOSIS — G8929 Other chronic pain: Secondary | ICD-10-CM

## 2024-04-03 ENCOUNTER — Encounter: Payer: Self-pay | Admitting: Orthopedic Surgery

## 2024-04-03 NOTE — Progress Notes (Signed)
 Office Visit Note   Patient: Erin Shepherd           Date of Birth: 24-Dec-1950           MRN: [id number] Visit Date: 03/29/2024              Requested by: Shepard Ade, MD [address] [place],  [place] [address] PCP: Shepard Ade, MD  Chief Complaint  Patient presents with   Right Knee - Routine Post Op    03/16/24 right knee scope with medial meniscectomy      HPI: Patient is a 73 year old woman who is 2 weeks status post right knee arthroscopy with debridement degenerative meniscal tear.  Patient states she has some start up stiffness.  Assessment & Plan: Visit Diagnoses:  1. Chronic pain of right knee     Plan: Will set up physical therapy in [place].  Follow-Up Instructions: Return in about 4 weeks (around 04/26/2024).   Ortho Exam  Patient is alert, oriented, no adenopathy, well-dressed, normal affect, normal respiratory effort. Examination patient still has swelling in the popliteal fossa.  There is also swelling in the suprapatellar pouch.    Imaging: No results found. No images are attached to the encounter.  Labs: Lab Results  Component Value Date   LABORGA ESCHERICHIA COLI 09/23/2012     Lab Results  Component Value Date   ALBUMIN 4.5 11/08/2019   ALBUMIN 4.4 08/29/2018    No results found for: MG Lab Results  Component Value Date   VD25OH 72.8 08/29/2018   VD25OH 41.3 08/02/2017   VD25OH 48 07/14/2016    No results found for: PREALBUMIN    Latest Ref Rng & Units 03/16/2024    6:23 AM 10/03/2018    1:39 PM 08/29/2018   11:30 AM  CBC EXTENDED  WBC 4.0 - 10.5 K/uL 5.2  5.2  5.3   RBC 3.87 - 5.11 MIL/uL 4.44  4.34  4.37   Hemoglobin 12.0 - 15.0 g/dL 86.2  86.5  86.7   HCT 36.0 - 46.0 % 41.7  39.2  39.5   Platelets 150 - 400 K/uL 193  228  207   NEUT# 1.4 - 7.0 x10E3/uL  3.5  3.4   Lymph# 0.7 - 3.1 x10E3/uL  1.2  1.0      There is no height or weight on file to calculate BMI.  Orders:  Orders Placed This Encounter   Procedures   Ambulatory referral to Physical Therapy   No orders of the defined types were placed in this encounter.    Procedures: No procedures performed  Clinical Data: No additional findings.  ROS:  All other systems negative, except as noted in the HPI. Review of Systems  Objective: Vital Signs: LMP 09/28/1999   Specialty Comments:  No specialty comments available.  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/16/2024   Syncope 07/08/2016   Low serum ferritin level 10/29/2015   Insomnia due to medical condition 10/29/2015   RESTLESS LEGS SYNDROME 08/02/2008   ALLERGIC RHINITIS 08/01/2008   INSOMNIA 08/01/2008   Past Medical History:  Diagnosis Date   Allergic rhinitis    Arthritis    Insomnia    Osteopenia    Restless leg syndrome    Squamous cell carcinoma 05/2005   left collar bone   Squamous cell carcinoma 04/09/2013   left collar  bone-about one inch from area removed in 2006    Family History  Problem Relation Age of Onset   Emphysema Mother    Asthma Mother    Heart disease Mother    Hypertension Mother    Restless legs syndrome Neg Hx     Past Surgical History:  Procedure Laterality Date   ABDOMINAL HYSTERECTOMY  2001   BUNIONECTOMY     KNEE ARTHROSCOPY WITH MEDIAL MENISECTOMY Right 03/16/2024   Procedure: RIGHT KNEE ARTHROSCOPY WITH MEDIAL MENISCECTOMY;  Surgeon: Harden Jerona GAILS, MD;  Location: [facility] OR;  Service: Orthopedics;  Laterality: Right;   squamous cell carcinoma removed  05/2005   left collar bone, reexcision 7/14   squamous cell carcinoma removed Right 02/2019   hand   tubes in ear     as a child   Social History   Occupational History   Not on file  Tobacco Use   Smoking status: Never   Smokeless tobacco: Never  Vaping Use   Vaping status: Never Used  Substance and Sexual Activity   Alcohol   use: Yes    Alcohol /week: 7.0 standard drinks of alcohol     Types: 7 Cans of beer per week    Comment: with lunch   Drug use: No   Sexual activity: Not Currently    Partners: Male    Birth control/protection: Surgical    Comment: TAH

## 2024-04-04 NOTE — Therapy (Unsigned)
 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 bone   Squamous cell carcinoma 04/09/2013   left collar bone-about one inch from area removed in 2006   Past Surgical History:  Procedure Laterality Date   ABDOMINAL HYSTERECTOMY  2001   BUNIONECTOMY     KNEE ARTHROSCOPY WITH MEDIAL MENISECTOMY Right 03/16/2024   Procedure: RIGHT KNEE ARTHROSCOPY WITH MEDIAL MENISCECTOMY;  Surgeon: Harden Jerona GAILS, MD;  Location: [facility] OR;  Service: Orthopedics;  Laterality: Right;   squamous cell carcinoma removed  05/2005   left collar bone, reexcision 7/14   squamous cell carcinoma removed Right 02/2019   hand   tubes in ear     as a child   Patient Active Problem List   Diagnosis Date Noted   Old complex tear of medial meniscus of right knee 03/16/2024   Osteochondral defect of condyle of femur 03/16/2024   Syncope 07/08/2016   Low serum ferritin level 10/29/2015   Insomnia due to medical condition 10/29/2015   RESTLESS LEGS SYNDROME 08/02/2008   ALLERGIC RHINITIS 08/01/2008   INSOMNIA 08/01/2008    PCP: ***  REFERRING PROVIDER: Harden Jerona GAILS, MD  REFERRING DIAG: [id number] (ICD-10-CM) - Chronic pain of right knee  THERAPY DIAG:  No diagnosis found.  Rationale for Evaluation and Treatment: Rehabilitation  ONSET DATE: ***  SUBJECTIVE:   SUBJECTIVE STATEMENT: ***  PERTINENT HISTORY: *** PAIN:  Are you having pain? {OPRCPAIN:27236}  PRECAUTIONS: {Therapy precautions:24002}  RED FLAGS: {PT Red Flags:29287}   WEIGHT BEARING RESTRICTIONS: {Yes ***/No:24003}  FALLS:  Has patient fallen in last 6 months? {fallsyesno:27318}  LIVING ENVIRONMENT: Lives with: {OPRC lives  with:25569::lives with their family} Lives in: {Lives in:25570} Stairs: {opstairs:27293} Has following equipment at home: {Assistive devices:23999}  OCCUPATION: ***  PLOF: {PLOF:24004}  PATIENT GOALS: ***  NEXT MD VISIT: ***  OBJECTIVE:  Note: Objective measures were completed at Evaluation unless otherwise noted.  DIAGNOSTIC FINDINGS:  Ligaments: The ACL, PCL, MCL and fibular collateral ligament are intact.   Menisci: Lateral meniscus is unremarkable. There is a tear of the posterior horn and body of the medial meniscus with an oblique vertical tear in the mid anterior body. There is no displaced meniscal flap.   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 Is a moderate reactive joint effusion and Baker's cyst.   There is a complex tear of the posterior horn and body medial meniscus. No displaced meniscal flap is present.  PATIENT SURVEYS:  LEFS  Extreme difficulty/unable (0), Quite a bit of difficulty (1), Moderate difficulty (2), Little difficulty (3), No difficulty (4) Survey date:    Any of your usual work, housework or school activities   2. Usual hobbies, recreational or sporting activities   3. Getting into/out of the bath   4. Walking between rooms   5. Putting on socks/shoes   6. Squatting    7. Lifting an object, like a bag of groceries from the floor   8. Performing light activities around your home   9. Performing heavy activities around your home   10. Getting into/out of a car   11.  Walking 2 blocks   12. Walking 1 mile   13. Going up/down 10 stairs (1 flight)   14. Standing for 1 hour   15.  sitting for 1 hour   16. Running on even ground   17. Running on uneven ground   18. Making sharp turns while running fast   19. Hopping    20. Rolling over in bed   Score total:  ***     COGNITION: Overall cognitive status:  {cognition:24006}     SENSATION: {sensation:27233}  EDEMA:  {edema:24020}  MUSCLE LENGTH: Hamstrings: Right *** deg; Left *** deg Debby test: Right *** deg; Left *** deg  POSTURE: {posture:25561}  PALPATION: ***  LOWER EXTREMITY ROM:  {AROM/PROM:27142} ROM Right eval Left eval  Hip flexion    Hip extension    Hip abduction    Hip adduction    Hip internal rotation    Hip external rotation    Knee flexion    Knee extension    Ankle dorsiflexion    Ankle plantarflexion    Ankle inversion    Ankle eversion     (Blank rows = not tested)  LOWER EXTREMITY MMT:  MMT Right eval Left eval  Hip flexion    Hip extension    Hip abduction    Hip adduction    Hip internal rotation    Hip external rotation    Knee flexion    Knee extension    Ankle dorsiflexion    Ankle plantarflexion    Ankle inversion    Ankle eversion     (Blank rows = not tested)  LOWER EXTREMITY SPECIAL TESTS:  {LEspecialtests:26242}  FUNCTIONAL TESTS:  {Functional tests:24029}  GAIT: Distance walked: *** Assistive device utilized: {Assistive devices:23999} Level of assistance: {Levels of assistance:24026} Comments: ***                                                                                                                                TREATMENT DATE:  04/05/24: PT Eval and HEP    PATIENT EDUCATION:  Education details: PT evaluation, objective findings, POC, Importance of HEP, Precautions, Clinic policies  Person educated: Patient Education method: Explanation and Demonstration Education comprehension: verbalized understanding and returned demonstration  HOME EXERCISE PROGRAM: ***  ASSESSMENT:  CLINICAL IMPRESSION: Patient is a 73 y.o. female who was seen today for physical therapy evaluation and treatment for M25.561,G89.29 (ICD-10-CM) - Chronic pain of right knee.   OBJECTIVE IMPAIRMENTS: {opptimpairments:25111}.   ACTIVITY LIMITATIONS:  {activitylimitations:27494}  PARTICIPATION LIMITATIONS: {participationrestrictions:25113}  PERSONAL FACTORS: {Personal factors:25162} are also affecting patient's functional outcome.   REHAB POTENTIAL: {rehabpotential:25112}  CLINICAL DECISION MAKING: {clinical decision making:25114}  EVALUATION COMPLEXITY: {Evaluation complexity:25115}   GOALS: Goals reviewed with patient? No  SHORT TERM GOALS: Target date: 04/19/24 Patient will be independent with performance of HEP to demonstrate adequate self management of symptoms.  Baseline:  Goal status: INITIAL  2.   Patient will report at  least a 25% improvement with function or pain overall since beginning PT. Baseline:  Goal status: INITIAL   LONG TERM GOALS: Target date: 05/17/24  *** Baseline:  Goal status: INITIAL  2.  *** Baseline:  Goal status: INITIAL  3.  *** Baseline:  Goal status: INITIAL  4.  *** Baseline:  Goal status: INITIAL  5.  *** Baseline:  Goal status: INITIAL  6.  *** Baseline:  Goal status: INITIAL   PLAN:  PT FREQUENCY: 2x/week  PT DURATION: 6 weeks  PLANNED INTERVENTIONS: 97164- PT Re-evaluation, 97110-Therapeutic exercises, 97530- Therapeutic activity, [id number]- Neuromuscular re-education, 97535- Self Care, 02859- Manual therapy, [id number]- Gait training, [id number]- Electrical stimulation (manual), [id number]- Traction (mechanical), 20560 (1-2 muscles), 20561 (3+ muscles)- Dry Needling, Patient/Family education, Balance training, Stair training, Taping, Joint mobilization, Spinal mobilization, Cryotherapy, and Moist heat  PLAN FOR NEXT SESSION: ***  2:21 PM, 04/04/24 Rosaria Settler, PT, DPT [facility]

## 2024-04-05 ENCOUNTER — Encounter (HOSPITAL_COMMUNITY): Payer: Self-pay

## 2024-04-05 ENCOUNTER — Other Ambulatory Visit: Payer: Self-pay

## 2024-04-05 ENCOUNTER — Ambulatory Visit (HOSPITAL_COMMUNITY): Attending: Orthopedic Surgery

## 2024-04-05 DIAGNOSIS — G8929 Other chronic pain: Secondary | ICD-10-CM | POA: Insufficient documentation

## 2024-04-05 DIAGNOSIS — Z7409 Other reduced mobility: Secondary | ICD-10-CM | POA: Insufficient documentation

## 2024-04-05 DIAGNOSIS — M25561 Pain in right knee: Secondary | ICD-10-CM | POA: Insufficient documentation

## 2024-04-05 DIAGNOSIS — M6281 Muscle weakness (generalized): Secondary | ICD-10-CM | POA: Diagnosis not present

## 2024-04-13 ENCOUNTER — Encounter (HOSPITAL_COMMUNITY): Payer: Self-pay

## 2024-04-13 ENCOUNTER — Ambulatory Visit (HOSPITAL_COMMUNITY)

## 2024-04-13 DIAGNOSIS — M6281 Muscle weakness (generalized): Secondary | ICD-10-CM

## 2024-04-13 DIAGNOSIS — Z7409 Other reduced mobility: Secondary | ICD-10-CM

## 2024-04-13 NOTE — Therapy (Signed)
 OUTPATIENT PHYSICAL THERAPY LOWER EXTREMITY TREATMENT   Patient Name: TREVOR DUTY MRN: [id number] DOB:10-09-1950, 73 y.o., female Today's Date: 04/13/2024  END OF SESSION:  PT End of Session - 04/13/24 1431     Visit Number 2    Number of Visits 9    Date for PT Re-Evaluation 04/26/24    Authorization Type [employer]    Authorization Time Period No auth, no limit    PT Start Time 1432    PT Stop Time 1512    PT Time Calculation (min) 40 min    Activity Tolerance Patient tolerated treatment well    Behavior During Therapy [facility] for tasks assessed/performed          Past Medical History:  Diagnosis Date   Allergic rhinitis    Arthritis    Insomnia    Osteopenia    Restless leg syndrome    Squamous cell carcinoma 05/2005   left collar bone   Squamous cell carcinoma 04/09/2013   left collar bone-about one inch from area removed in 2006   Past Surgical History:  Procedure Laterality Date   ABDOMINAL HYSTERECTOMY  2001   BUNIONECTOMY     KNEE ARTHROSCOPY WITH MEDIAL MENISECTOMY Right 03/16/2024   Procedure: RIGHT KNEE ARTHROSCOPY WITH MEDIAL MENISCECTOMY;  Surgeon: Harden Jerona GAILS, MD;  Location: [facility] OR;  Service: Orthopedics;  Laterality: Right;   squamous cell carcinoma removed  05/2005   left collar bone, reexcision 7/14   squamous cell carcinoma removed Right 02/2019   hand   tubes in ear     as a child   Patient Active Problem List   Diagnosis Date Noted   Old complex tear of medial meniscus of right knee 03/16/2024   Osteochondral defect of condyle of femur 03/16/2024   Syncope 07/08/2016   Low serum ferritin level 10/29/2015   Insomnia due to medical condition 10/29/2015   RESTLESS LEGS SYNDROME 08/02/2008   ALLERGIC RHINITIS 08/01/2008   INSOMNIA 08/01/2008    PCP: Shepard Ade, MD  REFERRING PROVIDER: Harden Jerona GAILS, MD Next apt 05/01/24  REFERRING DIAG: [phone] (ICD-10-CM) - Chronic pain of right knee  THERAPY DIAG:  Muscle  weakness (generalized)  Impaired functional mobility, balance, gait, and endurance  Rationale for Evaluation and Treatment: Rehabilitation  ONSET DATE: 03/16/24  SUBJECTIVE:   SUBJECTIVE STATEMENT: 04/13/24:  Knee feels tight today, some discomfort in back of knee baker's cyst.  Has began HEP without questions.  Eval:  3 weeks s/p right knee arthroscopy with medial meniscectomy debridement degenerative meniscal tear. Patient reports she's been active since surgery, walking and bike rides, but no formal exercise program. Still using ice and elevating, wearing compression some days. Reports yesterday her pain was a 9/10. Today 2/10 but most days on average 5/10 pain. Reports her Bakers cyst hurts her most some days   PERTINENT HISTORY: N/A  PAIN:  Are you having pain? Yes: NPRS scale: 2/10 Pain location: Medial and posterior sides of R knee  Pain description: Sharp/stabbing, pressure (Bakers cyst) Aggravating factors: Staying on it too much Relieving factors: Rest, elevate and ice  PRECAUTIONS: None  RED FLAGS: None   WEIGHT BEARING RESTRICTIONS: Yes WBAT  FALLS:  Has patient fallen in last 6 months? No  LIVING ENVIRONMENT: Lives with: lives with their spouse Stairs: Yes: External: 5 steps; can reach both Has following equipment at home: None  OCCUPATION: N/A  PLOF: Independent  PATIENT GOALS: To get it mobile and loose and get rid of  an pain:  NEXT MD VISIT: August 4th or 5th   OBJECTIVE:  Note: Objective measures were completed at Evaluation unless otherwise noted.  DIAGNOSTIC FINDINGS:  Ligaments: The ACL, PCL, MCL and fibular collateral ligament are intact.   Menisci: Lateral meniscus is unremarkable. There is a tear of the posterior horn and body of the medial meniscus with an oblique vertical tear in the mid anterior body. There is no displaced meniscal flap.   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 Is a moderate reactive joint effusion and Baker's cyst.   There is a complex tear of the posterior horn and body medial meniscus. No displaced meniscal flap is present.  PATIENT SURVEYS:    Lower Extremity Functional Score: 68 / 80 = 85.0 %  COGNITION: Overall cognitive status: Within functional limits for tasks assessed     SENSATION: WFL  EDEMA:  Mild edema present In   MUSCLE LENGTH: Hamstrings: mild tightness bilaterally    POSTURE: rounded shoulders and forward head  PALPATION: TTP in posterior knee TTP medial-inferior pole of R patella  LOWER EXTREMITY ROM:  Active ROM Right eval Left eval  Hip flexion    Hip extension    Hip abduction    Hip adduction    Hip internal rotation    Hip external rotation    Knee flexion WNL WNL  Knee extension WNL WNL  Ankle dorsiflexion    Ankle plantarflexion    Ankle inversion    Ankle eversion     (Blank rows = not tested)  LOWER EXTREMITY MMT:  MMT Right eval Left eval  Hip flexion    Hip extension 4- 4-  Hip abduction 4 4  Hip adduction    Hip internal rotation    Hip external rotation    Knee flexion 4- 4-  Knee extension 4 4  Ankle dorsiflexion 5 5  Ankle plantarflexion    Ankle inversion    Ankle eversion     (Blank rows = not tested)  LOWER EXTREMITY SPECIAL TESTS:    FUNCTIONAL TESTS:  30 seconds chair stand test 2 minute walk test: 580 ft  30 second chair: 14 STS, no UE support, reports pain/discomfort in LLE, clarifies as muscle fatigue  GAIT: Distance walked: 580 ft Assistive device utilized: None Level of assistance: Complete Independence Comments: WNL - good velocity, dec weight shift to R at times                                                                                                                                TREATMENT DATE:  04/13/24: Reviewed goals Educated importance of HEP compliance  Supine:  Bridge 15 with  GTB Sidelying:  Abduction 15 Prone:  Hip extension 10 Seated patella mobs all directions Seated:  STS 10x  Standing:  squat 10x 2 SLS Lt 60, Rt 60 first attempt Vector stance 3x5  Forward step up 6in 15 Lateral step up 4in 15x  04/05/24: PT Eval and HEP    PATIENT EDUCATION:  Education details: PT evaluation, objective findings, POC, Importance of HEP, Precautions, Clinic policies  Person educated: Patient Education method: Explanation and Demonstration Education comprehension: verbalized understanding and returned demonstration  HOME EXERCISE PROGRAM: Access Code: [email or web address] URL: [email or web address] Date: 04/05/2024 Prepared by: Rosaria Powell-Butler  Exercises - Hooklying Hamstring Stretch with Strap  - 2 x daily - 7 x weekly - 3 sets - 10 reps - 30 hold - Long Sitting 4 Way Patellar Glide  - 2 x daily - 7 x weekly - 3 sets - 10 reps - Supine Bridge  - 2 x daily - 7 x weekly - 3 sets - 10 reps - 5 hold - Sidelying Hip Abduction  - 2 x daily - 7 x weekly - 3 sets - 10 reps - Sit to Stand  - 2 x daily - 7 x weekly - 3 sets - 10 reps - Standing Knee Flexion AROM with Chair Support  - 2 x daily - 7 x weekly - 3 sets - 10 reps  04/13/24: - Squat with Chair Touch  - 2 x daily - 7 x weekly - 1 sets - 10 reps - Standing 3-Way Kick  - 1 x daily - 7 x weekly - 1 sets - 3 reps - 5 hold  ASSESSMENT:  CLINICAL IMPRESSION: 04/13/24:  Reviewed goals and educated importance of HEP compliance for maximal benefits with therapy.  Pt able to recall and demonstrate appropriate mechanics with current exercise program.  Session focus with functional strengthening and hip stability for balance.  Pt able to complete all exercises with good form with min cueing to slow down for improved control.  Presents with good balance, able to SLS 60 1st attempt.  Added squats for functional strenghtening for gluteal strengthening and vector stance to HEP for hip stability, printout given and  verbalized understanding.   Eval:  Patient is a 73 y.o. female who was seen today for physical therapy evaluation and treatment for M25.561,G89.29 (ICD-10-CM) - Chronic pain of right knee. At time of PT evaluation, patient is 3 weeks post-op. Patient demonstrates decreased strength, increased pain, slightly altered gait, and reports decreased activity tolerance/endurance which may all be contributing to patient impaired function. Patient reports being pretty active at baseline. Patient will benefit from continued skilled physical therapy in order to address the above to return to PLOF.     OBJECTIVE IMPAIRMENTS: Abnormal gait, decreased activity tolerance, decreased balance, decreased endurance, decreased strength, and pain.   ACTIVITY LIMITATIONS: lifting, bending, sitting, standing, squatting, stairs, and transfers  PARTICIPATION LIMITATIONS: community activity and yard work  PERSONAL FACTORS: N/A are also affecting patient's functional outcome.   REHAB POTENTIAL: Good  CLINICAL DECISION MAKING: Stable/uncomplicated  EVALUATION COMPLEXITY: Low   GOALS: Goals reviewed with patient? No  SHORT TERM GOALS: Target date: 04/19/24 Patient will be independent with performance of HEP to demonstrate adequate self management of symptoms.  Baseline:  Goal status: INITIAL  2.   Patient will report at least a 25% improvement with function or pain overall since beginning PT. Baseline:  Goal status: INITIAL   LONG TERM GOALS: Target date: 05/03/24 Patient will improve LEFS score by 9 points to demonstrate improved perceived function while meeting MCID.  Baseline: Goal status: INITIAL 2.  Patient will improve all LE MMT by at least a 1/2 a grade to show increased LE strength and/or power needed  for functional transfers.  Baseline:  Goal status: INITIAL   3.  Patient will improve 30 second STS by at least 3 STS to demonstrate improved LE endurance needed for community ambulation.   Baseline: Goal status: INITIAL    PLAN:  PT FREQUENCY: 1-2x/week  PT DURATION: 4 weeks  PLANNED INTERVENTIONS: 97164- PT Re-evaluation, 97110-Therapeutic exercises, 97530- Therapeutic activity, [id number]- Neuromuscular re-education, 97535- Self Care, 02859- Manual therapy, [phone]- Gait training, [phone]- Electrical stimulation (manual), [id number]- Traction (mechanical), [phone] (1-2 muscles), 20561 (3+ muscles)- Dry Needling, Patient/Family education, Balance training, Stair training, Taping, Joint mobilization, Spinal mobilization, Cryotherapy, and Moist heat  PLAN FOR NEXT SESSION: LE strengthening, balance  Augustin Mclean, LPTA/CLT; CBIS [phone]  4:43 PM, 04/13/24

## 2024-04-18 ENCOUNTER — Ambulatory Visit (HOSPITAL_COMMUNITY)

## 2024-04-18 DIAGNOSIS — M25561 Pain in right knee: Secondary | ICD-10-CM

## 2024-04-18 DIAGNOSIS — Z7409 Other reduced mobility: Secondary | ICD-10-CM

## 2024-04-18 DIAGNOSIS — M6281 Muscle weakness (generalized): Secondary | ICD-10-CM

## 2024-04-18 NOTE — Therapy (Signed)
 OUTPATIENT PHYSICAL THERAPY LOWER EXTREMITY TREATMENT   Patient Name: Erin Shepherd MRN: [id number] DOB:Dec 03, 1950, 73 y.o., female Today's Date: 04/18/2024  END OF SESSION:  PT End of Session - 04/18/24 1343     Visit Number 3    Number of Visits 9    Date for PT Re-Evaluation 04/26/24    Authorization Type [employer]    Authorization Time Period No auth, no limit    PT Start Time 1343    PT Stop Time 1423    PT Time Calculation (min) 40 min    Activity Tolerance Patient tolerated treatment well    Behavior During Therapy [facility] for tasks assessed/performed          Past Medical History:  Diagnosis Date   Allergic rhinitis    Arthritis    Insomnia    Osteopenia    Restless leg syndrome    Squamous cell carcinoma 05/2005   left collar bone   Squamous cell carcinoma 04/09/2013   left collar bone-about one inch from area removed in 2006   Past Surgical History:  Procedure Laterality Date   ABDOMINAL HYSTERECTOMY  2001   BUNIONECTOMY     KNEE ARTHROSCOPY WITH MEDIAL MENISECTOMY Right 03/16/2024   Procedure: RIGHT KNEE ARTHROSCOPY WITH MEDIAL MENISCECTOMY;  Surgeon: Harden Jerona GAILS, MD;  Location: [facility] OR;  Service: Orthopedics;  Laterality: Right;   squamous cell carcinoma removed  05/2005   left collar bone, reexcision 7/14   squamous cell carcinoma removed Right 02/2019   hand   tubes in ear     as a child   Patient Active Problem List   Diagnosis Date Noted   Old complex tear of medial meniscus of right knee 03/16/2024   Osteochondral defect of condyle of femur 03/16/2024   Syncope 07/08/2016   Low serum ferritin level 10/29/2015   Insomnia due to medical condition 10/29/2015   RESTLESS LEGS SYNDROME 08/02/2008   ALLERGIC RHINITIS 08/01/2008   INSOMNIA 08/01/2008    PCP: Shepard Ade, MD  REFERRING PROVIDER: Harden Jerona GAILS, MD Next apt 05/01/24  REFERRING DIAG: [phone] (ICD-10-CM) - Chronic pain of right knee  THERAPY DIAG:  Muscle  weakness (generalized)  Impaired functional mobility, balance, gait, and endurance  Right knee pain, unspecified chronicity  Rationale for Evaluation and Treatment: Rehabilitation  ONSET DATE: 03/16/24  SUBJECTIVE:   SUBJECTIVE STATEMENT: 04/18/24 Patient reports 2/10 pain right knee today; mostly the Bakers cyst posterior knee is what hurts her when the pain increases.    Eval:  3 weeks s/p right knee arthroscopy with medial meniscectomy debridement degenerative meniscal tear. Patient reports she's been active since surgery, walking and bike rides, but no formal exercise program. Still using ice and elevating, wearing compression some days. Reports yesterday her pain was a 9/10. Today 2/10 but most days on average 5/10 pain. Reports her Bakers cyst hurts her most some days   PERTINENT HISTORY: N/A  PAIN:  Are you having pain? Yes: NPRS scale: 2/10 Pain location: Medial and posterior sides of R knee  Pain description: Sharp/stabbing, pressure (Bakers cyst) Aggravating factors: Staying on it too much Relieving factors: Rest, elevate and ice  PRECAUTIONS: None  RED FLAGS: None   WEIGHT BEARING RESTRICTIONS: Yes WBAT  FALLS:  Has patient fallen in last 6 months? No  LIVING ENVIRONMENT: Lives with: lives with their spouse Stairs: Yes: External: 5 steps; can reach both Has following equipment at home: None  OCCUPATION: N/A  PLOF: Independent  PATIENT GOALS:  To get it mobile and loose and get rid of an pain:  NEXT MD VISIT: August 4th or 5th   OBJECTIVE:  Note: Objective measures were completed at Evaluation unless otherwise noted.  DIAGNOSTIC FINDINGS:  Ligaments: The ACL, PCL, MCL and fibular collateral ligament are intact.   Menisci: Lateral meniscus is unremarkable. There is a tear of the posterior horn and body of the medial meniscus with an oblique vertical tear in the mid anterior body. There is no displaced meniscal flap.   IMPRESSION: Mild  tricompartment osteoarthrosis with chondromalacia predominantly of the median ridge and medial facet of the patella. Is a moderate reactive joint effusion and Baker's cyst.   There is a complex tear of the posterior horn and body medial meniscus. No displaced meniscal flap is present.  PATIENT SURVEYS:    Lower Extremity Functional Score: 68 / 80 = 85.0 %  COGNITION: Overall cognitive status: Within functional limits for tasks assessed     SENSATION: WFL  EDEMA:  Mild edema present In   MUSCLE LENGTH: Hamstrings: mild tightness bilaterally    POSTURE: rounded shoulders and forward head  PALPATION: TTP in posterior knee TTP medial-inferior pole of R patella  LOWER EXTREMITY ROM:  Active ROM Right eval Left eval  Hip flexion    Hip extension    Hip abduction    Hip adduction    Hip internal rotation    Hip external rotation    Knee flexion WNL WNL  Knee extension WNL WNL  Ankle dorsiflexion    Ankle plantarflexion    Ankle inversion    Ankle eversion     (Blank rows = not tested)  LOWER EXTREMITY MMT:  MMT Right eval Left eval  Hip flexion    Hip extension 4- 4-  Hip abduction 4 4  Hip adduction    Hip internal rotation    Hip external rotation    Knee flexion 4- 4-  Knee extension 4 4  Ankle dorsiflexion 5 5  Ankle plantarflexion    Ankle inversion    Ankle eversion     (Blank rows = not tested)  LOWER EXTREMITY SPECIAL TESTS:    FUNCTIONAL TESTS:  30 seconds chair stand test 2 minute walk test: 580 ft  30 second chair: 14 STS, no UE support, reports pain/discomfort in LLE, clarifies as muscle fatigue  GAIT: Distance walked: 580 ft Assistive device utilized: None Level of assistance: Complete Independence Comments: WNL - good velocity, dec weight shift to R at times                                                                                                                                TREATMENT DATE:  04/18/24 Bike seat 11 x 5'  dynamic warm up Standing: Heel/toe raises 2 x 10 Calf stretch on step 10 x 10  Hip abduction with green theraband 2 x 10 Hip extension with green theraband 2 x 10  Updated HEP 7 step ups 2 x 10 7 lateral step ups 2 x 10 Tandem stance x 30 each way     04/13/24: Reviewed goals Educated importance of HEP compliance  Supine:  Bridge 15 with GTB Sidelying:  Abduction 15 Prone:  Hip extension 10 Seated patella mobs all directions Seated:  STS 10x  Standing:  squat 10x 2 SLS Lt 60, Rt 60 first attempt Vector stance 3x5 Forward step up 6in 15 Lateral step up 4in 15x  04/05/24: PT Eval and HEP    PATIENT EDUCATION:  Education details: PT evaluation, objective findings, POC, Importance of HEP, Precautions, Clinic policies  Person educated: Patient Education method: Explanation and Demonstration Education comprehension: verbalized understanding and returned demonstration  HOME EXERCISE PROGRAM: 04/18/24 - Standing Hip Abduction with Resistance at Ankles and Counter Support  - 2 x daily - 7 x weekly - 2 sets - 10 reps - Hip Extension with Resistance Loop  - 2 x daily - 7 x weekly - 2 sets - 10 reps Access Code: [email or web address] URL: [email or web address] Date: 04/05/2024 Prepared by: Rosaria Powell-Butler  Exercises - Hooklying Hamstring Stretch with Strap  - 2 x daily - 7 x weekly - 3 sets - 10 reps - 30 hold - Long Sitting 4 Way Patellar Glide  - 2 x daily - 7 x weekly - 3 sets - 10 reps - Supine Bridge  - 2 x daily - 7 x weekly - 3 sets - 10 reps - 5 hold - Sidelying Hip Abduction  - 2 x daily - 7 x weekly - 3 sets - 10 reps - Sit to Stand  - 2 x daily - 7 x weekly - 3 sets - 10 reps - Standing Knee Flexion AROM with Chair Support  - 2 x daily - 7 x weekly - 3 sets - 10 reps  04/13/24: - Squat with Chair Touch  - 2 x daily - 7 x weekly - 1 sets - 10 reps - Standing 3-Way Kick  - 1 x daily - 7 x weekly - 1 sets - 3 reps - 5 hold  ASSESSMENT:  CLINICAL  IMPRESSION: Patient with good form with hip exercises and needs minimal cues for form.  Noted moderate swelling right posterior knee and proximal knee above the kneecap.  So trial of kinesiotape to posterior knee today to try to decrease swelling.  Added balance exercise and patient tolerated all well without complaint of increased pain. Updated HEP and issued green theraband for home use.  Patient will benefit from continued skilled therapy services to address deficits and promote return to optimal function.        Eval:  Patient is a 73 y.o. female who was seen today for physical therapy evaluation and treatment for M25.561,G89.29 (ICD-10-CM) - Chronic pain of right knee. At time of PT evaluation, patient is 3 weeks post-op. Patient demonstrates decreased strength, increased pain, slightly altered gait, and reports decreased activity tolerance/endurance which may all be contributing to patient impaired function. Patient reports being pretty active at baseline. Patient will benefit from continued skilled physical therapy in order to address the above to return to PLOF.     OBJECTIVE IMPAIRMENTS: Abnormal gait, decreased activity tolerance, decreased balance, decreased endurance, decreased strength, and pain.   ACTIVITY LIMITATIONS: lifting, bending, sitting, standing, squatting, stairs, and transfers  PARTICIPATION LIMITATIONS: community activity and yard work  PERSONAL FACTORS: N/A are also affecting patient's functional outcome.   REHAB POTENTIAL: Good  CLINICAL DECISION MAKING: Stable/uncomplicated  EVALUATION COMPLEXITY: Low   GOALS: Goals reviewed with patient? No  SHORT TERM GOALS: Target date: 04/19/24 Patient will be independent with performance of HEP to demonstrate adequate self management of symptoms.  Baseline:  Goal status: INITIAL  2.   Patient will report at least a 25% improvement with function or pain overall since beginning PT. Baseline:  Goal status:  INITIAL   LONG TERM GOALS: Target date: 05/03/24 Patient will improve LEFS score by 9 points to demonstrate improved perceived function while meeting MCID.  Baseline: Goal status: INITIAL 2.  Patient will improve all LE MMT by at least a 1/2 a grade to show increased LE strength and/or power needed for functional transfers.  Baseline:  Goal status: INITIAL   3.  Patient will improve 30 second STS by at least 3 STS to demonstrate improved LE endurance needed for community ambulation.  Baseline: Goal status: INITIAL    PLAN:  PT FREQUENCY: 1-2x/week  PT DURATION: 4 weeks  PLANNED INTERVENTIONS: 97164- PT Re-evaluation, 97110-Therapeutic exercises, 97530- Therapeutic activity, 97112- Neuromuscular re-education, 97535- Self Care, 02859- Manual therapy, [id number]- Gait training, [phone]- Electrical stimulation (manual), [id number]- Traction (mechanical), [phone] (1-2 muscles), 20561 (3+ muscles)- Dry Needling, Patient/Family education, Balance training, Stair training, Taping, Joint mobilization, Spinal mobilization, Cryotherapy, and Moist heat  PLAN FOR NEXT SESSION: LE strengthening, balance  2:32 PM, 04/18/24 Franke Menter Small Keisha Amer MPT [facility] [place] [phone] [phone]

## 2024-04-20 ENCOUNTER — Encounter (HOSPITAL_COMMUNITY)

## 2024-04-20 ENCOUNTER — Encounter (HOSPITAL_COMMUNITY): Payer: Self-pay

## 2024-04-20 NOTE — Therapy (Signed)
 [facility] Outpatient Rehabilitation at [facility] [address] [place], [place], [address] Phone: [phone]   Fax:  [phone]  Patient Details  Name: Erin Shepherd MRN: [id number] Date of Birth: December 27, 1950 Referring Provider:  No ref. provider found  PHYSICAL THERAPY DISCHARGE SUMMARY  Visits from Start of Care: 3  Current functional level related to goals / functional outcomes: unknown   Remaining deficits: unknown   Education / Equipment: HEP   Patient agrees to discharge. Patient goals were not met. Patient is being discharged due to the patient's request.   [facility] Outpatient Rehabilitation at [facility] [address] [place], [place], [address] Phone: [phone]   Fax:  [phone]

## 2024-04-25 ENCOUNTER — Encounter (HOSPITAL_COMMUNITY)

## 2024-04-30 DIAGNOSIS — D0462 Carcinoma in situ of skin of left upper limb, including shoulder: Secondary | ICD-10-CM | POA: Diagnosis not present

## 2024-04-30 DIAGNOSIS — Z85828 Personal history of other malignant neoplasm of skin: Secondary | ICD-10-CM | POA: Diagnosis not present

## 2024-04-30 DIAGNOSIS — D485 Neoplasm of uncertain behavior of skin: Secondary | ICD-10-CM | POA: Diagnosis not present

## 2024-04-30 DIAGNOSIS — L720 Epidermal cyst: Secondary | ICD-10-CM | POA: Diagnosis not present

## 2024-05-01 ENCOUNTER — Ambulatory Visit (INDEPENDENT_AMBULATORY_CARE_PROVIDER_SITE_OTHER): Admitting: Orthopedic Surgery

## 2024-05-01 ENCOUNTER — Encounter: Payer: Self-pay | Admitting: Orthopedic Surgery

## 2024-05-01 DIAGNOSIS — M1711 Unilateral primary osteoarthritis, right knee: Secondary | ICD-10-CM

## 2024-05-01 NOTE — Progress Notes (Signed)
 Office Visit Note   Patient: Erin Shepherd           Date of Birth: 1950/10/27           MRN: [id number] Visit Date: 05/01/2024              Requested by: Shepard Ade, MD [address] [place],  [place] [address] PCP: Shepard Ade, MD  Chief Complaint  Patient presents with   Right Knee - Routine Post Op    03/16/24 right knee scope with medial meniscectomy      HPI: Patient is a 73 year old woman who is status post right knee arthroscopy June 20.  Patient has completed some physical therapy sessions.  She states the Baker's cyst may be a little smaller she is still having pain.  Assessment & Plan: Visit Diagnoses: No diagnosis found.  Plan: Continue with activities and strengthening.  Reevaluate in 2 months.  We discussed total knee arthroplasty as well as continued conservative treatment.  Follow-Up Instructions: Return in about 2 months (around 07/01/2024).   Ortho Exam  Patient is alert, oriented, no adenopathy, well-dressed, normal affect, normal respiratory effort. Examination there is no effusion she still has a Baker's cyst.  Tender primarily over the medial joint line.  Arthroscopic findings showed meniscal tear as well as osteochondral defects.    Imaging: No results found. No images are attached to the encounter.  Labs: Lab Results  Component Value Date   LABORGA ESCHERICHIA COLI 09/23/2012     Lab Results  Component Value Date   ALBUMIN 4.5 11/08/2019   ALBUMIN 4.4 08/29/2018    No results found for: MG Lab Results  Component Value Date   VD25OH 72.8 08/29/2018   VD25OH 41.3 08/02/2017   VD25OH 48 07/14/2016    No results found for: PREALBUMIN    Latest Ref Rng & Units 03/16/2024    6:23 AM 10/03/2018    1:39 PM 08/29/2018   11:30 AM  CBC EXTENDED  WBC 4.0 - 10.5 K/uL 5.2  5.2  5.3   RBC 3.87 - 5.11 MIL/uL 4.44  4.34  4.37   Hemoglobin 12.0 - 15.0 g/dL 86.2  86.5  86.7   HCT 36.0 - 46.0 % 41.7  39.2  39.5   Platelets 150 - 400  K/uL 193  228  207   NEUT# 1.4 - 7.0 x10E3/uL  3.5  3.4   Lymph# 0.7 - 3.1 x10E3/uL  1.2  1.0      There is no height or weight on file to calculate BMI.  Orders:  No orders of the defined types were placed in this encounter.  No orders of the defined types were placed in this encounter.    Procedures: No procedures performed  Clinical Data: No additional findings.  ROS:  All other systems negative, except as noted in the HPI. Review of Systems  Objective: Vital Signs: LMP 09/28/1999   Specialty Comments:  No specialty comments available.  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/16/2024   Syncope 07/08/2016   Low serum ferritin level 10/29/2015   Insomnia due to medical condition 10/29/2015   RESTLESS LEGS SYNDROME 08/02/2008   ALLERGIC RHINITIS 08/01/2008   INSOMNIA 08/01/2008   Past Medical History:  Diagnosis Date   Allergic rhinitis    Arthritis    Insomnia    Osteopenia  Restless leg syndrome    Squamous cell carcinoma 05/2005   left collar bone   Squamous cell carcinoma 04/09/2013   left collar bone-about one inch from area removed in 2006    Family History  Problem Relation Age of Onset   Emphysema Mother    Asthma Mother    Heart disease Mother    Hypertension Mother    Restless legs syndrome Neg Hx     Past Surgical History:  Procedure Laterality Date   ABDOMINAL HYSTERECTOMY  2001   BUNIONECTOMY     KNEE ARTHROSCOPY WITH MEDIAL MENISECTOMY Right 03/16/2024   Procedure: RIGHT KNEE ARTHROSCOPY WITH MEDIAL MENISCECTOMY;  Surgeon: Harden Jerona GAILS, MD;  Location: [facility] OR;  Service: Orthopedics;  Laterality: Right;   squamous cell carcinoma removed  05/2005   left collar bone, reexcision 7/14   squamous cell carcinoma removed Right 02/2019   hand   tubes in ear     as a child   Social History    Occupational History   Not on file  Tobacco Use   Smoking status: Never   Smokeless tobacco: Never  Vaping Use   Vaping status: Never Used  Substance and Sexual Activity   Alcohol  use: Yes    Alcohol /week: 7.0 standard drinks of alcohol     Types: 7 Cans of beer per week    Comment: with lunch   Drug use: No   Sexual activity: Not Currently    Partners: Male    Birth control/protection: Surgical    Comment: TAH

## 2024-05-02 ENCOUNTER — Encounter (HOSPITAL_COMMUNITY)

## 2024-05-03 ENCOUNTER — Encounter (HOSPITAL_COMMUNITY): Admitting: Physical Therapy

## 2024-05-10 ENCOUNTER — Encounter (HOSPITAL_COMMUNITY)

## 2024-06-04 ENCOUNTER — Telehealth: Payer: Self-pay | Admitting: Adult Health

## 2024-06-04 DIAGNOSIS — H53143 Visual discomfort, bilateral: Secondary | ICD-10-CM | POA: Diagnosis not present

## 2024-06-04 DIAGNOSIS — S0501XA Injury of conjunctiva and corneal abrasion without foreign body, right eye, initial encounter: Secondary | ICD-10-CM | POA: Diagnosis not present

## 2024-06-04 NOTE — Telephone Encounter (Signed)
 Patient need to reschedule due to personal family matter. Patient decided to keep appointment.

## 2024-06-05 ENCOUNTER — Telehealth (INDEPENDENT_AMBULATORY_CARE_PROVIDER_SITE_OTHER): Payer: PPO | Admitting: Adult Health

## 2024-06-05 ENCOUNTER — Telehealth: Payer: Self-pay | Admitting: Adult Health

## 2024-06-05 DIAGNOSIS — G2581 Restless legs syndrome: Secondary | ICD-10-CM | POA: Diagnosis not present

## 2024-06-05 MED ORDER — GABAPENTIN 100 MG PO CAPS: ORAL_CAPSULE | ORAL | 11 refills | Status: AC

## 2024-06-05 MED ORDER — ESCITALOPRAM OXALATE 10 MG PO TABS: 10.0000 mg | ORAL_TABLET | Freq: Every day | ORAL | 3 refills | Status: AC

## 2024-06-05 MED ORDER — CARBIDOPA-LEVODOPA 25-100 MG PO TABS: ORAL_TABLET | ORAL | 11 refills | Status: AC

## 2024-06-05 NOTE — Progress Notes (Signed)
 PATIENT: Erin Shepherd DOB: November 22, 1950  REASON FOR VISIT: follow up HISTORY FROM: patient  Virtual Visit via Video Note  I connected with Erin Shepherd on 06/05/24 at 11:00 AM EDT by a video enabled telemedicine application located remotely at [facility] and verified that I am speaking with the correct person using two identifiers who was located at their own home in [place].   I discussed the limitations of evaluation and management by telemedicine and the availability of in person appointments. The patient expressed understanding and agreed to proceed.   PATIENT: Erin Shepherd DOB: Feb 01, 1951  REASON FOR VISIT: follow up HISTORY FROM: patient  HISTORY OF PRESENT ILLNESS: Today 06/05/24:  Erin Shepherd is a 73 y.o. female with a history of restless leg syndrome. Returns today for follow-up.  Overall she reports that she has been doing well.  She states that occasionally she will try to reduce her dose of gabapentin  and Sinemet  but often has to increase the dose back.  She states that the symptoms primarily bother her in the evenings.  Although she will have some during the day if she tries to take a nap or she gets on an airplane.  She is currently taking gabapentin  200 mg at 5 PM 100 mg at 530 and 630 and then 100 mg at bedtime.  She is taking Sinemet  2 tablets at 500, 1 tablet at 530 and again at 630.  She states that this combination is working well for her.  She denies any unpleasant side effects.  Returns today for an evaluation.     06/10/23: Erin Shepherd is a 73 y.o. female with a history of RLS. Returns today for follow-up.  She remains on gabapentin , and Sinemet .  She takes gabapentin  100 mg at 5 PM, 200 mg at 6:30 PM and another 200 mg at 8 PM.  She takes Sinemet  25-100 mg 2 tablets at 5 PM and 1 tablet at 6:30 PM.  This continues to work well for her.  Occasionally she will have breakthrough symptoms.  There is often times that she tries to reduce her dose  of medication.  06/02/22: Erin Shepherd is a 73 year old female with a history of restless leg syndrome.  She returns today for virtual visit.  She remains on gabapentin  taking 100 mg in the evenings around 5 PM, 200 mg at 630 and another 200 mg at 8 PM.  She also remains on Sinemet  25-100 mg 2 tablets at 5 PM and 1 tablet at 630.  She states that this combination has given her the best benefit. She does try to reduce her sinemet  to 1 tablet a day and sometimes that works well. Some days she still needs all three tablets.  She denies any new symptoms.  She returns today for an evaluation.  HISTORY 05/26/21: Erin Shepherd is a 73 y.o. female being followed for her restless leg syndrome. She is currently taking gabapentin  100 MG PO at 5 pm, 200 MG at 6:30 pm and again at 8 pm. She also takes Sinemet  2 tablets at 5 pm and 1 tablet at 6:30 pm. Denies any issues at this time and reports that her restless legs have been under good control 90% of the time. Patient reports she is sleeping well and enjoys working in the garden  REVIEW OF SYSTEMS: Out of a complete 14 system review of symptoms, the patient complains only of the following symptoms, and all other reviewed systems are negative.  ALLERGIES: Allergies  Allergen Reactions   Sertraline     Other Reaction(s): felt in a zone   Trazodone Hcl     Other Reaction(s): ineffective   Zolpidem     Other Reaction(s): could not sleep for a weekafter stopping   Sulfonamide Derivatives Rash    Childhood    HOME MEDICATIONS: Outpatient Medications Prior to Visit  Medication Sig Dispense Refill   Biotin 89999 MCG TABS Take by mouth daily.     CALCIUM PO Take 2 tablets by mouth daily. Viactiv/ gummies     carbidopa -levodopa  (SINEMET  IR) 25-100 MG tablet TAKE TWO TABLETS BY MOUTH AT FIVE IN THE EVENING AND ONE TABLET AT 6:30 IN THE EVENING (Patient taking differently: Take 1 tablet by mouth See admin instructions. Take 1 tablet at 1700, 1730 and 1830) 270 tablet 3    Cholecalciferol (VITAMIN D3) 125 MCG (5000 UT) TABS Take 5,000 Units by mouth daily.     escitalopram  (LEXAPRO ) 10 MG tablet Take 1 tablet (10 mg total) by mouth daily. (Patient taking differently: Take 10 mg by mouth at bedtime.) 90 tablet 3   fluticasone  (FLONASE ) 50 MCG/ACT nasal spray Place 2 sprays into both nostrils daily. (Patient taking differently: Place 2 sprays into both nostrils daily as needed for allergies.) 16 g 3   gabapentin  (NEURONTIN ) 100 MG capsule TAKE ONE CAPSULE BY MOUTH AT 5PM,AND TWO CAPSULES BY MOUTH AT 6:30PM AND 8PM (Patient taking differently: Take 100 mg by mouth See admin instructions. Take 100 mg at 1700, 1730, 1830 and 2000 mg) 450 capsule 3   Multiple Vitamins-Minerals (MULTIVITAMIN WITH MINERALS) tablet Take 2 tablets by mouth daily. Woman /Gummies     Omega-3 Fatty Acids (FISH OIL) 1000 MG CAPS Take 2,000 mg by mouth daily.     oxyCODONE -acetaminophen  (PERCOCET/ROXICET) 5-325 MG tablet Take 1 tablet by mouth every 6 (six) hours as needed. 30 tablet 0   Prenatal Multivit-Min-Fe-FA (PRE-NATAL FORMULA PO) Take 1 tablet by mouth daily.     No facility-administered medications prior to visit.    PAST MEDICAL HISTORY: Past Medical History:  Diagnosis Date   Allergic rhinitis    Arthritis    Insomnia    Osteopenia    Restless leg syndrome    Squamous cell carcinoma 05/2005   left collar bone   Squamous cell carcinoma 04/09/2013   left collar bone-about one inch from area removed in 2006    PAST SURGICAL HISTORY: Past Surgical History:  Procedure Laterality Date   ABDOMINAL HYSTERECTOMY  2001   BUNIONECTOMY     KNEE ARTHROSCOPY WITH MEDIAL MENISECTOMY Right 03/16/2024   Procedure: RIGHT KNEE ARTHROSCOPY WITH MEDIAL MENISCECTOMY;  Surgeon: Harden Jerona GAILS, MD;  Location: [facility] OR;  Service: Orthopedics;  Laterality: Right;   squamous cell carcinoma removed  05/2005   left collar bone, reexcision 7/14   squamous cell carcinoma removed Right 02/2019   hand    tubes in ear     as a child    FAMILY HISTORY: Family History  Problem Relation Age of Onset   Emphysema Mother    Asthma Mother    Heart disease Mother    Hypertension Mother    Restless legs syndrome Neg Hx     SOCIAL HISTORY: Social History   Socioeconomic History   Marital status: Married    Spouse name: Not on file   Number of children: Y   Years of education: Not on file   Highest education level: Not on file  Occupational  History   Not on file  Tobacco Use   Smoking status: Never   Smokeless tobacco: Never  Vaping Use   Vaping status: Never Used  Substance and Sexual Activity   Alcohol  use: Yes    Alcohol /week: 7.0 standard drinks of alcohol     Types: 7 Cans of beer per week    Comment: with lunch   Drug use: No   Sexual activity: Not Currently    Partners: Male    Birth control/protection: Surgical    Comment: TAH  Other Topics Concern   Not on file  Social History Narrative   Not on file   Social Drivers of Health   Financial Resource Strain: Not on file  Food Insecurity: Not on file  Transportation Needs: Not on file  Physical Activity: Not on file  Stress: Not on file  Social Connections: Not on file  Intimate Partner Violence: Not on file      PHYSICAL EXAM Generalized: Well developed, in no acute distress   Neurological examination  Mentation: Alert oriented to time, place, history taking. Follows all commands speech and language fluent Cranial nerve II-XII:Extraocular movements were full. Facial symmetry noted.   DIAGNOSTIC DATA (LABS, IMAGING, TESTING) - I reviewed patient records, labs, notes, testing and imaging myself where available.  Lab Results  Component Value Date   WBC 5.2 03/16/2024   HGB 13.7 03/16/2024   HCT 41.7 03/16/2024   MCV 93.9 03/16/2024   PLT 193 03/16/2024      Component Value Date/Time   NA 138 11/08/2019 0947   K 4.0 11/08/2019 0947   CL 99 11/08/2019 0947   CO2 27 11/08/2019 0947   GLUCOSE 71  11/08/2019 0947   BUN 16 11/08/2019 0947   CREATININE 0.85 11/08/2019 0947   CALCIUM 9.6 11/08/2019 0947   PROT 6.6 11/08/2019 0947   ALBUMIN 4.5 11/08/2019 0947   AST 31 11/08/2019 0947   ALT 27 11/08/2019 0947   ALKPHOS 96 11/08/2019 0947   BILITOT 0.4 11/08/2019 0947   GFRNONAA 71 11/08/2019 0947   GFRAA 81 11/08/2019 0947   Lab Results  Component Value Date   CHOL 265 (H) 12/08/2020   HDL 74 12/08/2020   LDLCALC 175 (H) 12/08/2020   TRIG 81 12/08/2020   CHOLHDL 3.6 12/08/2020    Lab Results  Component Value Date   TSH 1.320 08/29/2018      ASSESSMENT AND PLAN 73 y.o. year old female  has a past medical history of Allergic rhinitis, Arthritis, Insomnia, Osteopenia, Restless leg syndrome, Squamous cell carcinoma (05/2005), and Squamous cell carcinoma (04/09/2013). here with:  1.  Restless leg syndrome  Continue Gabapentin   200 mg at 5PM, 100 mg at 530 and 630PM and at bedtime. Continue Sinemet  25-100 mg 2 tablets at 5PM and 1 tablet at 5:30 and 6:30PM Advised if symptoms worsen or she develops new symptoms she should let us  know Follow-up in 1 year or sooner if needed   Meds ordered this encounter  Medications   carbidopa -levodopa  (SINEMET  IR) 25-100 MG tablet    Sig: Take 2 tablets at 5 PM.  Take 1 tablet at 5:30 and again at 6:30 PM.    Dispense:  90 tablet    Refill:  11    Supervising Provider:   AHERN, ANTONIA B [[id number]]   gabapentin  (NEURONTIN ) 100 MG capsule    Sig: Take 200 mg at 5:00, 100 mg at 530, 630 and at bedtime    Dispense:  150 capsule  Refill:  11    Supervising Provider:   INES ONETHA NOVAK [[id number]]     Duwaine Russell, MSN, NP-C 06/05/2024, 10:50 AM [facility] [address] [place], [place] [address] [phone]

## 2024-06-05 NOTE — Telephone Encounter (Signed)
 Patient request refill for escitalopram  (LEXAPRO ) 10 MG tablet send to [facility]

## 2024-06-05 NOTE — Telephone Encounter (Signed)
 Patient had a video visit today with Megan NP. Patient would like a refill of  escitalopram  (LEXAPRO ) 10 MG tablets. Patient's last refill was 04/21/24. (90 pills was dispensed for the last 90 day refill.) Please advise if patient is to continue this medication it was not listed in recent note.

## 2024-07-02 ENCOUNTER — Ambulatory Visit: Admitting: Orthopedic Surgery

## 2024-07-02 DIAGNOSIS — M1711 Unilateral primary osteoarthritis, right knee: Secondary | ICD-10-CM

## 2024-07-03 ENCOUNTER — Encounter: Payer: Self-pay | Admitting: Orthopedic Surgery

## 2024-07-03 NOTE — Progress Notes (Signed)
 Office Visit Note   Patient: Erin Shepherd           Date of Birth: 11/29/1950           MRN: [id number] Visit Date: 07/02/2024              Requested by: Shepard Ade, MD [address] [place],  [place] [address] PCP: Shepard Ade, MD  Chief Complaint  Patient presents with   Right Knee - Follow-up    03/16/24 right knee scope with medial meniscectomy         HPI: Discussed the use of AI scribe software for clinical note transcription with the patient, who gave verbal consent to proceed.  History of Present Illness She is a 73 year old female who presents for follow-up after right knee arthroscopy.  She underwent right knee arthroscopy in June. She feels somewhat better after engaging in jogging while walking her dog, but otherwise feels about the same as before the procedure.     Assessment & Plan: Visit Diagnoses:  1. Unilateral primary osteoarthritis, right knee     Plan: Assessment and Plan Assessment & Plan Right knee pain and effusion status post arthroscopy Persistent pain and effusion post-arthroscopy with patellofemoral crepitation and medial lateral joint line tenderness. Improvement noted with jogging. Not considering total knee arthroplasty. Discussed hyaluronic acid injection for symptom management. - Continue exercising and strengthening. - Call to schedule hyaluronic acid injection if interested. - Follow-up appointment in early January.      Follow-Up Instructions: Return in about 3 months (around 10/02/2024).   Ortho Exam  Patient is alert, oriented, no adenopathy, well-dressed, normal affect, normal respiratory effort. Physical Exam MUSCULOSKELETAL: Right knee effusion present. Crepitation of right patellofemoral joint with range of motion, tenderness over medial lateral joint line.      Imaging: No results found. No images are attached to the encounter.  Labs: Lab Results  Component Value Date   LABORGA ESCHERICHIA COLI  09/23/2012     Lab Results  Component Value Date   ALBUMIN 4.5 11/08/2019   ALBUMIN 4.4 08/29/2018    No results found for: MG Lab Results  Component Value Date   VD25OH 72.8 08/29/2018   VD25OH 41.3 08/02/2017   VD25OH 48 07/14/2016    No results found for: PREALBUMIN    Latest Ref Rng & Units 03/16/2024    6:23 AM 10/03/2018    1:39 PM 08/29/2018   11:30 AM  CBC EXTENDED  WBC 4.0 - 10.5 K/uL 5.2  5.2  5.3   RBC 3.87 - 5.11 MIL/uL 4.44  4.34  4.37   Hemoglobin 12.0 - 15.0 g/dL 86.2  86.5  86.7   HCT 36.0 - 46.0 % 41.7  39.2  39.5   Platelets 150 - 400 K/uL 193  228  207   NEUT# 1.4 - 7.0 x10E3/uL  3.5  3.4   Lymph# 0.7 - 3.1 x10E3/uL  1.2  1.0      There is no height or weight on file to calculate BMI.  Orders:  No orders of the defined types were placed in this encounter.  No orders of the defined types were placed in this encounter.    Procedures: No procedures performed  Clinical Data: No additional findings.  ROS:  All other systems negative, except as noted in the HPI. Review of Systems  Objective: Vital Signs: LMP 09/28/1999   Specialty Comments:  No specialty comments available.  PMFS History: Patient Active Problem List  Diagnosis Date Noted   Old complex tear of medial meniscus of right knee 03/16/2024   Osteochondral defect of condyle of femur 03/16/2024   Syncope 07/08/2016   Low serum ferritin level 10/29/2015   Insomnia due to medical condition 10/29/2015   RESTLESS LEGS SYNDROME 08/02/2008   ALLERGIC RHINITIS 08/01/2008   INSOMNIA 08/01/2008   Past Medical History:  Diagnosis Date   Allergic rhinitis    Arthritis    Insomnia    Osteopenia    Restless leg syndrome    Squamous cell carcinoma 05/2005   left collar bone   Squamous cell carcinoma 04/09/2013   left collar bone-about one inch from area removed in 2006    Family History  Problem Relation Age of Onset   Emphysema Mother    Asthma Mother    Heart disease  Mother    Hypertension Mother    Restless legs syndrome Neg Hx     Past Surgical History:  Procedure Laterality Date   ABDOMINAL HYSTERECTOMY  2001   BUNIONECTOMY     KNEE ARTHROSCOPY WITH MEDIAL MENISECTOMY Right 03/16/2024   Procedure: RIGHT KNEE ARTHROSCOPY WITH MEDIAL MENISCECTOMY;  Surgeon: Harden Jerona GAILS, MD;  Location: [facility] OR;  Service: Orthopedics;  Laterality: Right;   squamous cell carcinoma removed  05/2005   left collar bone, reexcision 7/14   squamous cell carcinoma removed Right 02/2019   hand   tubes in ear     as a child   Social History   Occupational History   Not on file  Tobacco Use   Smoking status: Never   Smokeless tobacco: Never  Vaping Use   Vaping status: Never Used  Substance and Sexual Activity   Alcohol  use: Yes    Alcohol /week: 7.0 standard drinks of alcohol     Types: 7 Cans of beer per week    Comment: with lunch   Drug use: No   Sexual activity: Not Currently    Partners: Male    Birth control/protection: Surgical    Comment: TAH

## 2024-07-04 DIAGNOSIS — L812 Freckles: Secondary | ICD-10-CM | POA: Diagnosis not present

## 2024-07-04 DIAGNOSIS — L821 Other seborrheic keratosis: Secondary | ICD-10-CM | POA: Diagnosis not present

## 2024-07-04 DIAGNOSIS — Z85828 Personal history of other malignant neoplasm of skin: Secondary | ICD-10-CM | POA: Diagnosis not present

## 2024-07-04 DIAGNOSIS — L570 Actinic keratosis: Secondary | ICD-10-CM | POA: Diagnosis not present

## 2024-07-26 DIAGNOSIS — M79642 Pain in left hand: Secondary | ICD-10-CM | POA: Diagnosis not present

## 2024-07-27 ENCOUNTER — Ambulatory Visit: Payer: Self-pay

## 2024-07-29 ENCOUNTER — Ambulatory Visit: Admission: RE | Admit: 2024-07-29 | Discharge: 2024-07-29 | Disposition: A | Discharge status: Home / Self Care

## 2024-07-29 ENCOUNTER — Ambulatory Visit

## 2024-07-29 VITALS — BP 165/75 | HR 76 | Temp 97.9°F | Resp 20

## 2024-07-29 DIAGNOSIS — M1810 Unilateral primary osteoarthritis of first carpometacarpal joint, unspecified hand: Secondary | ICD-10-CM | POA: Diagnosis not present

## 2024-07-29 DIAGNOSIS — M25531 Pain in right wrist: Secondary | ICD-10-CM

## 2024-07-29 DIAGNOSIS — W19XXXA Unspecified fall, initial encounter: Secondary | ICD-10-CM | POA: Diagnosis not present

## 2024-07-29 DIAGNOSIS — Z043 Encounter for examination and observation following other accident: Secondary | ICD-10-CM | POA: Diagnosis not present

## 2024-07-29 NOTE — ED Triage Notes (Signed)
 Pt reports right wrist pain after a fall that happened Friday afternoon. While walking her dog.

## 2024-07-29 NOTE — ED Provider Notes (Signed)
 [facility]    CSN: [id number] Arrival date & time: 07/29/24  0844      History   Chief Complaint Chief Complaint  Patient presents with   Wrist Pain    Clemens - Entered by patient    HPI Erin Shepherd is a 73 y.o. female.   Presenting today with right radial wrist pain, bruising, swelling after a fall onto outstretched hand that occurred Friday afternoon.  She states she tripped while walking her dog.  Denies numbness, tingling, loss of range of motion, skin injury.  So far trying Ace wrap, ice with mild improvement of it.    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/16/2024   Syncope 07/08/2016   Low serum ferritin level 10/29/2015   Insomnia due to medical condition 10/29/2015   RESTLESS LEGS SYNDROME 08/02/2008   ALLERGIC RHINITIS 08/01/2008   INSOMNIA 08/01/2008    Past Surgical History:  Procedure Laterality Date   ABDOMINAL HYSTERECTOMY  2001   BUNIONECTOMY     KNEE ARTHROSCOPY WITH MEDIAL MENISECTOMY Right 03/16/2024   Procedure: RIGHT KNEE ARTHROSCOPY WITH MEDIAL MENISCECTOMY;  Surgeon: Harden Jerona GAILS, MD;  Location: [facility] OR;  Service: Orthopedics;  Laterality: Right;   squamous cell carcinoma removed  05/2005   left collar bone, reexcision 7/14   squamous cell carcinoma removed Right 02/2019   hand   tubes in ear     as a child    OB History     Gravida  1   Para  1   Term      Preterm      AB      Living  1      SAB      IAB      Ectopic      Multiple      Live Births               Home Medications    Prior to  Admission medications   Medication Sig Start Date End Date Taking? Authorizing Provider  Biotin 89999 MCG TABS Take by mouth daily.    [provider]  CALCIUM PO Take 2 tablets by mouth daily. Viactiv/ gummies    [provider]  carbidopa -levodopa  (SINEMET  IR) 25-100 MG tablet Take 2 tablets at 5 PM.  Take 1 tablet at 5:30 and again at 6:30 PM. 06/05/24   Millikan, Megan, NP  Cholecalciferol (VITAMIN D3) 125 MCG (5000 UT) TABS Take 5,000 Units by mouth daily.    [provider]  escitalopram  (LEXAPRO ) 10 MG tablet Take 1 tablet (10 mg total) by mouth at bedtime. 06/05/24   Millikan, Megan, NP  gabapentin  (NEURONTIN ) 100 MG capsule Take 200 mg at 5:00, 100 mg at 530, 630 and at bedtime 06/05/24   Millikan, Megan, NP  Multiple Vitamins-Minerals (MULTIVITAMIN WITH MINERALS) tablet Take 2 tablets by mouth daily. Woman /Gummies    [provider]  Omega-3 Fatty Acids (FISH OIL) 1000 MG CAPS Take 2,000 mg by mouth  daily.    [provider]  oxyCODONE -acetaminophen  (PERCOCET/ROXICET) 5-325 MG tablet Take 1 tablet by mouth every 6 (six) hours as needed. 03/16/24   Gerome Maurilio HERO, PA-C  Prenatal Multivit-Min-Fe-FA (PRE-NATAL FORMULA PO) Take 1 tablet by mouth daily.    [provider]    Family History Family History  Problem Relation Age of Onset   Emphysema Mother    Asthma Mother    Heart disease Mother    Hypertension Mother    Restless legs syndrome Neg Hx     Social History Social History   Tobacco Use   Smoking status: Never   Smokeless tobacco: Never  Vaping Use   Vaping status: Never Used  Substance Use Topics   Alcohol  use: Yes    Alcohol /week: 7.0 standard drinks of alcohol     Types: 7 Cans of beer per week    Comment: with lunch   Drug use: No     Allergies   Sertraline, Trazodone hcl, Zolpidem, and Sulfonamide derivatives   Review of Systems Review of Systems PER HPI  Physical Exam Triage Vital Signs ED Triage  Vitals  Encounter Vitals Group     BP 07/29/24 0910 (!) 165/75     Girls Systolic BP Percentile --      Girls Diastolic BP Percentile --      Boys Systolic BP Percentile --      Boys Diastolic BP Percentile --      Pulse Rate 07/29/24 0910 76     Resp 07/29/24 0910 20     Temp 07/29/24 0910 97.9 F (36.6 C)     Temp Source 07/29/24 0910 Oral     SpO2 07/29/24 0910 97 %     Weight --      Height --      Head Circumference --      Peak Flow --      Pain Score 07/29/24 0915 3     Pain Loc --      Pain Education --      Exclude from Growth Chart --    No data found.  Updated Vital Signs BP (!) 165/75 (BP Location: Right Arm)   Pulse 76   Temp 97.9 F (36.6 C) (Oral)   Resp 20   LMP 09/28/1999   SpO2 97%   Visual Acuity Right Eye Distance:   Left Eye Distance:   Bilateral Distance:    Right Eye Near:   Left Eye Near:    Bilateral Near:     Physical Exam Vitals and nursing note reviewed.  Constitutional:      Appearance: Normal appearance. She is not ill-appearing.  HENT:     Head: Atraumatic.  Eyes:     Extraocular Movements: Extraocular movements intact.     Conjunctiva/sclera: Conjunctivae normal.  Cardiovascular:     Rate and Rhythm: Normal rate.  Pulmonary:     Effort: Pulmonary effort is normal.  Musculoskeletal:        General: Tenderness and signs of injury present. No swelling or deformity. Normal range of motion.     Cervical back: Normal range of motion and neck supple.     Comments: Tenderness to palpation, bruising, slight edema to the radial aspect of the right wrist into the thenar aspect of the right hand  Skin:    General: Skin is warm and dry.     Findings: Bruising present. No erythema.  Neurological:     Mental Status: She is alert and oriented  to person, place, and time.     Comments: Right upper extremity neurovascularly intact  Psychiatric:        Mood and Affect: Mood normal.        Thought Content: Thought content normal.         Judgment: Judgment normal.      UC Treatments / Results  Labs (all labs ordered are listed, but only abnormal results are displayed) Labs Reviewed - No data to display  EKG   Radiology DG Wrist Complete Right Result Date: 07/29/2024 EXAM: 3 Or More View(s) Xray Of The Wrist 07/29/2024 09:29:51 Am COMPARISON: None Available. CLINICAL HISTORY: Clemens While Walking Dog FINDINGS: BONES AND JOINTS: Moderate Degenerative Changes Of The First [facility] Joint. SOFT TISSUES: The Soft Tissues Are Unremarkable. IMPRESSION: 1. No acute fracture or dislocation. 2. Moderate degenerative changes of the first [facility] joint. Electronically signed by: Norleen Kil MD 07/29/2024 09:57 AM EST RP Workstation: [id number]    Procedures Procedures (including critical care time)  Medications Ordered in UC Medications - No data to display  Initial Impression / Assessment and Plan / UC Course  I have reviewed the triage vital signs and the nursing notes.  Pertinent labs & imaging results that were available during my care of the patient were reviewed by me and considered in my medical decision making (see chart for details).     X-ray of the right wrist negative for acute bony abnormality.  Ace wrap applied, discussed RICE protocol, over-the-counter pain relievers.  Return for worsening or unresolving symptoms.  Final Clinical Impressions(s) / UC Diagnoses   Final diagnoses:  Right wrist pain     Discharge Instructions      Rest, ice, compression, elevation, over-the-counter pain relievers as needed.  We will let you know if anything comes back abnormal on your x-ray     ED Prescriptions   None    PDMP not reviewed this encounter.   Stuart Vernell Norris, [place] 07/29/24 1017

## 2024-07-29 NOTE — Discharge Instructions (Addendum)
 Rest, ice, compression, elevation, over-the-counter pain relievers as needed.  We will let you know if anything comes back abnormal on your x-ray

## 2024-07-30 ENCOUNTER — Encounter: Payer: Self-pay | Admitting: Radiology

## 2024-08-06 DIAGNOSIS — M79642 Pain in left hand: Secondary | ICD-10-CM | POA: Diagnosis not present

## 2024-08-20 DIAGNOSIS — M79642 Pain in left hand: Secondary | ICD-10-CM | POA: Diagnosis not present

## 2024-08-28 DIAGNOSIS — S62617A Displaced fracture of proximal phalanx of left little finger, initial encounter for closed fracture: Secondary | ICD-10-CM | POA: Diagnosis not present

## 2024-10-02 ENCOUNTER — Ambulatory Visit: Admitting: Orthopedic Surgery

## 2024-10-02 DIAGNOSIS — G8929 Other chronic pain: Secondary | ICD-10-CM

## 2024-10-02 DIAGNOSIS — M25561 Pain in right knee: Secondary | ICD-10-CM | POA: Diagnosis not present

## 2024-10-02 DIAGNOSIS — M1711 Unilateral primary osteoarthritis, right knee: Secondary | ICD-10-CM | POA: Diagnosis not present

## 2024-10-03 ENCOUNTER — Encounter: Payer: Self-pay | Admitting: Orthopedic Surgery

## 2024-10-03 DIAGNOSIS — M1711 Unilateral primary osteoarthritis, right knee: Secondary | ICD-10-CM

## 2024-10-03 MED ORDER — METHYLPREDNISOLONE ACETATE 40 MG/ML IJ SUSP
40.0000 mg | INTRAMUSCULAR | Status: AC | PRN
  Administered 2024-10-03: 40 mg via INTRA_ARTICULAR

## 2024-10-03 MED ORDER — LIDOCAINE HCL (PF) 1 % IJ SOLN
5.0000 mL | INTRAMUSCULAR | Status: AC | PRN
  Administered 2024-10-03: 5 mL

## 2024-10-03 NOTE — Progress Notes (Signed)
 "  Office Visit Note   Patient: Erin Shepherd           Date of Birth: 1951-04-22           MRN: [id number] Visit Date: 10/02/2024              Requested by: Shepard Ade, MD [address] [place],  [place] [address] PCP: Shepard Ade, MD  Chief Complaint  Patient presents with   Right Knee - Follow-up    03/16/24 right knee scope with medial meniscectomy      HPI: Discussed the use of AI scribe software for clinical note transcription with the patient, who gave verbal consent to proceed.  History of Present Illness Erin Shepherd is a 74 year old female with right knee osteoarthritis status post arthroscopic debridement who presents for evaluation of persistent right knee pain.  She has experienced persistent right knee pain since February of the previous year, described as severe and localized to the medial joint line. The pain is associated with crepitus and visible swelling, and is exacerbated by ambulation and weather changes. She has difficulty sleeping due to the pain, which can be excruciating and leads her to alter her gait, including walking on her toes or flat-footed. After a few steps, the pain may ease somewhat, but overall, symptoms have remained unchanged since onset.  She underwent right knee arthroscopy on June 20th of the previous year. Despite several weeks of formal physical therapy postoperatively, she reports persistent pain. She continues to perform strengthening exercises at home and remains physically active, but has had to modify her activity due to pain.  She has not received intra-articular corticosteroid or viscosupplementation injections for the right knee. She expresses concern regarding the impact of swelling and pain on daily activities, including walking her dog, and inquires about the efficacy and duration of cortisone injections. She has not used a knee brace but is interested in whether it would provide benefit. She denies other joint involvement,  fever, chills, or constitutional symptoms.     Assessment & Plan: Visit Diagnoses: No diagnosis found.  Plan: Assessment and Plan Assessment & Plan Right knee osteoarthritis with meniscal derangement, status post arthroscopy Severe osteoarthritis with meniscal derangement and persistent symptoms post-arthroscopy. Advanced cartilage loss confirmed. Corticosteroid injection expected to provide temporary relief. Total knee arthroplasty is definitive management if symptoms become intolerable. - Administered right knee intra-articular corticosteroid injection via intermedial portal. - Advised rest for the remainder of the day and to resume normal activities, including ambulation, tomorrow. - Corticosteroid injections may be repeated every 3-4 months as needed for symptom relief. - Viscosupplementation unlikely to provide significant benefit given bone-on-bone disease. - Discussed total knee arthroplasty as definitive treatment if symptoms become intolerable, including perioperative expectations, overnight hospitalization, home therapy, and need for aggressive rehabilitation. - Emphasized importance of ongoing strength training and resistance exercises to optimize knee function pre- and post-operatively. - Advised continuation of home exercise program as previously instructed by physical therapy; additional formal therapy may help maintain strength but is not expected to alter underlying pathology. - Discussed that knee braces may provide warmth but do not offer structural support or analgesia. - Encouraged follow-up as needed based on symptom progression and readiness for surgical intervention.      Follow-Up Instructions: No follow-ups on file.   Ortho Exam  Patient is alert, oriented, no adenopathy, well-dressed, normal affect, normal respiratory effort. Physical Exam MUSCULOSKELETAL: Effusion of the right knee with crepitation and tenderness to palpation of  the medial joint line.   Collateral and cruciates of the right knee are stable.   No redness no cellulitis no signs of infection or gout   Imaging: No results found. No images are attached to the encounter.  Labs: Lab Results  Component Value Date   LABORGA ESCHERICHIA COLI 09/23/2012     Lab Results  Component Value Date   ALBUMIN 4.5 11/08/2019   ALBUMIN 4.4 08/29/2018    No results found for: MG Lab Results  Component Value Date   VD25OH 72.8 08/29/2018   VD25OH 41.3 08/02/2017   VD25OH 48 07/14/2016    No results found for: PREALBUMIN    Latest Ref Rng & Units 03/16/2024    6:23 AM 10/03/2018    1:39 PM 08/29/2018   11:30 AM  CBC EXTENDED  WBC 4.0 - 10.5 K/uL 5.2  5.2  5.3   RBC 3.87 - 5.11 MIL/uL 4.44  4.34  4.37   Hemoglobin 12.0 - 15.0 g/dL 86.2  86.5  86.7   HCT 36.0 - 46.0 % 41.7  39.2  39.5   Platelets 150 - 400 K/uL 193  228  207   NEUT# 1.4 - 7.0 x10E3/uL  3.5  3.4   Lymph# 0.7 - 3.1 x10E3/uL  1.2  1.0      There is no height or weight on file to calculate BMI.  Orders:  No orders of the defined types were placed in this encounter.  No orders of the defined types were placed in this encounter.    Procedures: Large Joint Inj: R knee on 10/03/2024 7:35 AM Indications: pain and diagnostic evaluation Details: 22 G 1.5 in needle, anteromedial approach  Arthrogram: No  Medications: 5 mL lidocaine  (PF) 1 %; 40 mg methylPREDNISolone  acetate 40 MG/ML Outcome: tolerated well, no immediate complications Procedure, treatment alternatives, risks and benefits explained, specific risks discussed. Consent was given by the patient. Immediately prior to procedure a time out was called to verify the correct patient, procedure, equipment, support staff and site/side marked as required. Patient was prepped and draped in the usual sterile fashion.      Clinical Data: No additional findings.  ROS:  All other systems negative, except as noted in the HPI. Review of  Systems  Objective: Vital Signs: LMP 09/28/1999   Specialty Comments:  No specialty comments available.  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/16/2024   Syncope 07/08/2016   Low serum ferritin level 10/29/2015   Insomnia due to medical condition 10/29/2015   RESTLESS LEGS SYNDROME 08/02/2008   ALLERGIC RHINITIS 08/01/2008   INSOMNIA 08/01/2008   Past Medical History:  Diagnosis Date   Allergic rhinitis    Arthritis    Insomnia    Osteopenia    Restless leg syndrome    Squamous cell carcinoma 05/2005   left collar bone   Squamous cell carcinoma 04/09/2013   left collar bone-about one inch from area removed in 2006    Family History  Problem Relation Age of Onset   Emphysema Mother    Asthma Mother    Heart disease Mother    Hypertension Mother    Restless legs syndrome Neg Hx     Past Surgical History:  Procedure Laterality Date   ABDOMINAL HYSTERECTOMY  2001   BUNIONECTOMY     KNEE ARTHROSCOPY WITH MEDIAL MENISECTOMY Right 03/16/2024  Procedure: RIGHT KNEE ARTHROSCOPY WITH MEDIAL MENISCECTOMY;  Surgeon: Harden Jerona GAILS, MD;  Location: [facility] OR;  Service: Orthopedics;  Laterality: Right;   squamous cell carcinoma removed  05/2005   left collar bone, reexcision 7/14   squamous cell carcinoma removed Right 02/2019   hand   tubes in ear     as a child   Social History   Occupational History   Not on file  Tobacco Use   Smoking status: Never   Smokeless tobacco: Never  Vaping Use   Vaping status: Never Used  Substance and Sexual Activity   Alcohol  use: Yes    Alcohol /week: 7.0 standard drinks of alcohol     Types: 7 Cans of beer per week    Comment: with lunch   Drug use: No   Sexual activity: Not Currently    Partners: Male    Birth control/protection: Surgical    Comment: TAH         "

## 2025-06-04 ENCOUNTER — Telehealth: Admitting: Adult Health
